# Patient Record
Sex: Female | Born: 1960 | Race: White | Hispanic: No | Smoking: Former smoker
Health system: Southern US, Community
[De-identification: ages and names within clinical notes are randomized; demographics above are authoritative.]

## PROBLEM LIST (undated history)

## (undated) DIAGNOSIS — IMO0002 Reserved for concepts with insufficient information to code with codable children: Secondary | ICD-10-CM

## (undated) DIAGNOSIS — F1020 Alcohol dependence, uncomplicated: Secondary | ICD-10-CM

## (undated) DIAGNOSIS — I10 Essential (primary) hypertension: Secondary | ICD-10-CM

## (undated) DIAGNOSIS — M329 Systemic lupus erythematosus, unspecified: Secondary | ICD-10-CM

## (undated) DIAGNOSIS — K766 Portal hypertension: Secondary | ICD-10-CM

## (undated) DIAGNOSIS — T1491XA Suicide attempt, initial encounter: Secondary | ICD-10-CM

## (undated) DIAGNOSIS — N200 Calculus of kidney: Secondary | ICD-10-CM

## (undated) DIAGNOSIS — E7800 Pure hypercholesterolemia, unspecified: Secondary | ICD-10-CM

## (undated) DIAGNOSIS — S0990XA Unspecified injury of head, initial encounter: Secondary | ICD-10-CM

## (undated) HISTORY — PX: CHOLECYSTECTOMY: SHX55

## (undated) HISTORY — PX: BACK SURGERY: SHX140

## (undated) HISTORY — PX: ABDOMINAL HYSTERECTOMY: SHX81

---

## 1996-07-25 HISTORY — PX: GASTRIC BYPASS: SHX52

## 1997-07-25 DIAGNOSIS — T1491XA Suicide attempt, initial encounter: Secondary | ICD-10-CM

## 1997-07-25 HISTORY — DX: Suicide attempt, initial encounter: T14.91XA

## 2011-05-13 ENCOUNTER — Emergency Department (HOSPITAL_COMMUNITY)
Admission: EM | Admit: 2011-05-13 | Discharge: 2011-05-13 | Discharge status: Against Medical Advice | Payer: Medicare Other | Attending: Emergency Medicine | Admitting: Emergency Medicine

## 2011-05-13 DIAGNOSIS — F1010 Alcohol abuse, uncomplicated: Secondary | ICD-10-CM | POA: Insufficient documentation

## 2011-05-13 DIAGNOSIS — I10 Essential (primary) hypertension: Secondary | ICD-10-CM | POA: Insufficient documentation

## 2011-05-13 LAB — DIFFERENTIAL
Basophils Absolute: 0 10*3/uL (ref 0.0–0.1)
Basophils Relative: 0 % (ref 0–1)
Eosinophils Absolute: 0.2 10*3/uL (ref 0.0–0.7)
Monocytes Relative: 6 % (ref 3–12)
Neutro Abs: 4.9 10*3/uL (ref 1.7–7.7)
Neutrophils Relative %: 60 % (ref 43–77)

## 2011-05-13 LAB — RAPID URINE DRUG SCREEN, HOSP PERFORMED
Barbiturates: POSITIVE — AB
Cocaine: POSITIVE — AB
Tetrahydrocannabinol: NOT DETECTED

## 2011-05-13 LAB — CBC
Hemoglobin: 10.6 g/dL — ABNORMAL LOW (ref 12.0–15.0)
Platelets: 323 10*3/uL (ref 150–400)
RBC: 4.79 MIL/uL (ref 3.87–5.11)
WBC: 8.1 10*3/uL (ref 4.0–10.5)

## 2011-05-13 LAB — COMPREHENSIVE METABOLIC PANEL
ALT: 16 U/L (ref 0–35)
AST: 22 U/L (ref 0–37)
Alkaline Phosphatase: 131 U/L — ABNORMAL HIGH (ref 39–117)
CO2: 26 mEq/L (ref 19–32)
Calcium: 9.3 mg/dL (ref 8.4–10.5)
Chloride: 102 mEq/L (ref 96–112)
GFR calc Af Amer: >90 mL/min (ref >90)
GFR calc non Af Amer: >90 mL/min (ref >90)
Glucose, Bld: 93 mg/dL (ref 70–99)
Potassium: 3.6 mEq/L (ref 3.5–5.1)
Sodium: 140 mEq/L (ref 135–145)

## 2011-05-15 ENCOUNTER — Emergency Department (HOSPITAL_COMMUNITY)
Admission: EM | Admit: 2011-05-15 | Discharge: 2011-05-15 | Disposition: A | Discharge status: Home / Self Care | Payer: Medicare Other | Attending: Emergency Medicine | Admitting: Emergency Medicine

## 2011-05-15 ENCOUNTER — Emergency Department (HOSPITAL_COMMUNITY): Payer: Medicare Other

## 2011-05-15 DIAGNOSIS — W108XXA Fall (on) (from) other stairs and steps, initial encounter: Secondary | ICD-10-CM | POA: Insufficient documentation

## 2011-05-15 DIAGNOSIS — Z981 Arthrodesis status: Secondary | ICD-10-CM | POA: Insufficient documentation

## 2011-05-15 DIAGNOSIS — IMO0002 Reserved for concepts with insufficient information to code with codable children: Secondary | ICD-10-CM | POA: Insufficient documentation

## 2011-05-15 DIAGNOSIS — I10 Essential (primary) hypertension: Secondary | ICD-10-CM | POA: Insufficient documentation

## 2011-05-17 ENCOUNTER — Encounter (HOSPITAL_COMMUNITY): Payer: Self-pay | Admitting: Emergency Medicine

## 2011-08-02 ENCOUNTER — Encounter: Payer: Medicare Other | Admitting: Physical Medicine & Rehabilitation

## 2011-08-12 ENCOUNTER — Emergency Department (HOSPITAL_COMMUNITY)
Admission: EM | Admit: 2011-08-12 | Discharge: 2011-08-13 | Disposition: A | Discharge status: Home / Self Care | Payer: Medicare Other | Attending: Emergency Medicine | Admitting: Emergency Medicine

## 2011-08-12 ENCOUNTER — Encounter (HOSPITAL_COMMUNITY): Payer: Self-pay | Admitting: Emergency Medicine

## 2011-08-12 DIAGNOSIS — R51 Headache: Secondary | ICD-10-CM | POA: Insufficient documentation

## 2011-08-12 DIAGNOSIS — H53459 Other localized visual field defect, unspecified eye: Secondary | ICD-10-CM | POA: Insufficient documentation

## 2011-08-12 DIAGNOSIS — I10 Essential (primary) hypertension: Secondary | ICD-10-CM | POA: Insufficient documentation

## 2011-08-12 DIAGNOSIS — R110 Nausea: Secondary | ICD-10-CM | POA: Insufficient documentation

## 2011-08-12 DIAGNOSIS — Z79899 Other long term (current) drug therapy: Secondary | ICD-10-CM | POA: Insufficient documentation

## 2011-08-12 HISTORY — DX: Essential (primary) hypertension: I10

## 2011-08-12 HISTORY — DX: Calculus of kidney: N20.0

## 2011-08-12 LAB — BASIC METABOLIC PANEL
BUN: 13 mg/dL (ref 6–23)
Calcium: 9.4 mg/dL (ref 8.4–10.5)
Creatinine, Ser: 0.62 mg/dL (ref 0.50–1.10)
GFR calc non Af Amer: >90 mL/min (ref >90)
Glucose, Bld: 104 mg/dL — ABNORMAL HIGH (ref 70–99)
Sodium: 136 mEq/L (ref 135–145)

## 2011-08-12 LAB — CBC
Hemoglobin: 12.4 g/dL (ref 12.0–15.0)
MCHC: 31.4 g/dL (ref 30.0–36.0)
RDW: 21 % — ABNORMAL HIGH (ref 11.5–15.5)
WBC: 9.4 10*3/uL (ref 4.0–10.5)

## 2011-08-12 LAB — DIFFERENTIAL
Eosinophils Absolute: 0.2 10*3/uL (ref 0.0–0.7)
Eosinophils Relative: 2 % (ref 0–5)
Lymphs Abs: 2.2 10*3/uL (ref 0.7–4.0)
Monocytes Absolute: 0.6 10*3/uL (ref 0.1–1.0)
Neutrophils Relative %: 68 % (ref 43–77)

## 2011-08-12 MED ORDER — OXYCODONE-ACETAMINOPHEN 5-325 MG PO TABS: 2.0000 | ORAL_TABLET | ORAL | Status: AC | PRN

## 2011-08-12 MED ORDER — MORPHINE SULFATE 4 MG/ML IJ SOLN
4.0000 mg | Freq: Once | INTRAMUSCULAR | Status: AC
  Administered 2011-08-12: 4 mg via INTRAVENOUS
  Filled 2011-08-12: qty 1

## 2011-08-12 MED ORDER — LABETALOL HCL 5 MG/ML IV SOLN
20.0000 mg | Freq: Once | INTRAVENOUS | Status: DC
  Filled 2011-08-12: qty 4

## 2011-08-12 MED ORDER — KETOROLAC TROMETHAMINE 30 MG/ML IJ SOLN
30.0000 mg | Freq: Once | INTRAMUSCULAR | Status: AC
  Administered 2011-08-12: 30 mg via INTRAVENOUS
  Filled 2011-08-12: qty 1

## 2011-08-12 MED ORDER — SODIUM CHLORIDE 0.9 % IV SOLN
INTRAVENOUS | Status: DC
  Administered 2011-08-12: 21:00:00 via INTRAVENOUS

## 2011-08-12 MED ORDER — PROMETHAZINE HCL 25 MG/ML IJ SOLN
25.0000 mg | INTRAMUSCULAR | Status: AC
  Administered 2011-08-12: 25 mg via INTRAVENOUS
  Filled 2011-08-12: qty 1

## 2011-08-12 NOTE — ED Notes (Signed)
Per ems report- Pt coming from home where her blood pressure began to get too high.  Pt states that bp normally runs around 138/65.  Pt bp at home was 178/98.  Pt also states she is having a headache and blurred vision.

## 2011-08-12 NOTE — ED Provider Notes (Signed)
History     CSN: [id number]  Arrival date & time 08/12/11  1900   First MD Initiated Contact with Patient 08/12/11 1914      Chief Complaint  Patient presents with  . Hypertension    (Consider location/radiation/quality/duration/timing/severity/associated sxs/prior treatment) Patient is a 51 y.o. female presenting with hypertension. The history is provided by the patient.  Hypertension Associated symptoms include headaches. Pertinent negatives include no chest pain and no shortness of breath.   the patient is a 51 year old, female, with a history of hypertension, who presents to emergency department complaining of a throbbing headache with seeing flashing lights, and nausea.  She states that her blood pressure has been elevated today, and she is very worried about how high.  It is.  She states that the systolic blood pressure was greater than 100.  She takes 20 mg of lisinopril daily for her hypertension.  She has not missed her medications.  She called her  Family doctor and was told to come to the emergency department for further evaluation.  She denies vomiting, cough, difficulty breathing, diarrhea, urinary tract symptoms.  She denies pain anywhere except in her head.  Past Medical History  Diagnosis Date  . Hypertension   . Kidney stones     Past Surgical History  Procedure Date  . Back surgery   . Cholecystectomy     No family history on file.  History  Substance Use Topics  . Smoking status: Not on file  . Smokeless tobacco: Not on file  . Alcohol Use:     OB History    Grav Para Term Preterm Abortions TAB SAB Ect Mult Living                  Review of Systems  Constitutional: Negative for fever.  Eyes:       [place]  Respiratory: Negative for shortness of breath.   Cardiovascular: Negative for chest pain.  Gastrointestinal: Positive for nausea. Negative for vomiting.  Genitourinary: Negative for dysuria.  Neurological: Positive for headaches. Negative  for dizziness.  Psychiatric/Behavioral: Negative for confusion.  All other systems reviewed and are negative.    Allergies  Kiwi extract and Morphine and related  Home Medications   Current Outpatient Rx  Name Route Sig Dispense Refill  . ALBUTEROL SULFATE HFA 108 (90 BASE) MCG/ACT IN AERS Inhalation Inhale 2 puffs into the lungs every 6 (six) hours as needed. For shortness of breath    . BUPROPION HCL ER (XL) 300 MG PO TB24 Oral Take 300 mg by mouth daily.    Marland Kitchen EPIPEN IJ Injection Inject 1 application as directed See admin instructions. For sever allergic reaction    . FERROUS FUMARATE 325 (106 FE) MG PO TABS Oral Take 1 tablet by mouth 3 (three) times daily.    Marland Kitchen LIDOCAINE 5 % EX PTCH Transdermal Place 1 patch onto the skin daily. Remove & Discard patch within 12 hours or as directed by MD    . LISINOPRIL 20 MG PO TABS Oral Take 20 mg by mouth daily.    Marland Kitchen LOPERAMIDE HCL 2 MG PO CAPS Oral Take 4 mg by mouth 4 (four) times daily.    Marland Kitchen OMEPRAZOLE 20 MG PO CPDR Oral Take 20 mg by mouth at bedtime.    . OMEPRAZOLE 40 MG PO CPDR Oral Take 40 mg by mouth daily.    Marland Kitchen ONDANSETRON 8 MG PO TBDP Oral Take 8 mg by mouth every 8 (eight) hours as needed. For  nausea    . OXYCODONE-ACETAMINOPHEN 5-325 MG PO TABS Oral Take 2 tablets by mouth every 6 (six) hours as needed. For pain    . ROPINIROLE HCL 1 MG PO TABS Oral Take 1 mg by mouth at bedtime.    Marland Kitchen TEMAZEPAM 30 MG PO CAPS Oral Take 30 mg by mouth at bedtime.    . VENLAFAXINE HCL ER 150 MG PO CP24 Oral Take 150 mg by mouth daily.      BP 162/100  Pulse 82  Temp(Src) 98.1 F (36.7 C) (Oral)  Resp 16  SpO2 98%  Physical Exam  Vitals reviewed. Constitutional: She is oriented to person, place, and time. She appears well-developed and well-nourished.  HENT:  Head: Normocephalic and atraumatic.  Eyes: EOM are normal. Pupils are equal, round, and reactive to light.  Neck: Normal range of motion. Neck supple.       No nuchal rigidity    Cardiovascular: Normal rate and regular rhythm.   No murmur heard. Pulmonary/Chest: Effort normal and breath sounds normal.  Abdominal: Soft. Bowel sounds are normal. There is no tenderness.  Musculoskeletal: Normal range of motion.  Neurological: She is alert and oriented to person, place, and time. No cranial nerve deficit. Coordination normal.  Skin: Skin is warm and dry. No rash noted.  Psychiatric: Her behavior is normal. Judgment and thought content normal.       Anxious    ED Course  Procedures (including critical care time) 51 year old, female, with a history of hypertension, presents with recorded, elevated blood pressure 278/114.  Presently, her blood pressure is 144/93.  She also has symptoms consistent with a migraine headache.  I will treat her migraine headache, and perform blood chemistry, to look at her renal function, but there is no indication for treating her blood pressure at this time.  She has no evidence of endorgan damage.  At this time.   Labs Reviewed  CBC  DIFFERENTIAL  BASIC METABOLIC PANEL   No results found.   No diagnosis found.  11:48 PM sxs resolved  MDM  Hypertension Migraine - no neurological deficit.  Alteration in mental status.  No signs of CNS or systemic infection.  There is no indication for a CAT scan at this time.        Nicholes Stairs, MD 08/12/11 2348

## 2011-08-17 ENCOUNTER — Emergency Department (HOSPITAL_COMMUNITY)
Admission: EM | Admit: 2011-08-17 | Discharge: 2011-08-18 | Disposition: A | Discharge status: Other Acute Inpatient Hospital | Payer: Medicare Other | Source: Home / Self Care | Attending: Emergency Medicine | Admitting: Emergency Medicine

## 2011-08-17 ENCOUNTER — Encounter (HOSPITAL_COMMUNITY): Payer: Self-pay | Admitting: *Deleted

## 2011-08-17 ENCOUNTER — Other Ambulatory Visit: Payer: Self-pay

## 2011-08-17 DIAGNOSIS — M25569 Pain in unspecified knee: Secondary | ICD-10-CM | POA: Insufficient documentation

## 2011-08-17 DIAGNOSIS — F3289 Other specified depressive episodes: Secondary | ICD-10-CM | POA: Insufficient documentation

## 2011-08-17 DIAGNOSIS — R45851 Suicidal ideations: Secondary | ICD-10-CM | POA: Insufficient documentation

## 2011-08-17 DIAGNOSIS — R404 Transient alteration of awareness: Secondary | ICD-10-CM | POA: Insufficient documentation

## 2011-08-17 DIAGNOSIS — K766 Portal hypertension: Secondary | ICD-10-CM | POA: Insufficient documentation

## 2011-08-17 DIAGNOSIS — Y92009 Unspecified place in unspecified non-institutional (private) residence as the place of occurrence of the external cause: Secondary | ICD-10-CM | POA: Insufficient documentation

## 2011-08-17 DIAGNOSIS — T07XXXA Unspecified multiple injuries, initial encounter: Secondary | ICD-10-CM | POA: Insufficient documentation

## 2011-08-17 DIAGNOSIS — M949 Disorder of cartilage, unspecified: Secondary | ICD-10-CM | POA: Insufficient documentation

## 2011-08-17 DIAGNOSIS — F329 Major depressive disorder, single episode, unspecified: Secondary | ICD-10-CM | POA: Insufficient documentation

## 2011-08-17 DIAGNOSIS — Z79899 Other long term (current) drug therapy: Secondary | ICD-10-CM | POA: Insufficient documentation

## 2011-08-17 DIAGNOSIS — M329 Systemic lupus erythematosus, unspecified: Secondary | ICD-10-CM | POA: Insufficient documentation

## 2011-08-17 DIAGNOSIS — M899 Disorder of bone, unspecified: Secondary | ICD-10-CM | POA: Insufficient documentation

## 2011-08-17 HISTORY — DX: Portal hypertension: K76.6

## 2011-08-17 HISTORY — DX: Alcohol dependence, uncomplicated: F10.20

## 2011-08-17 HISTORY — DX: Systemic lupus erythematosus, unspecified: M32.9

## 2011-08-17 HISTORY — DX: Reserved for concepts with insufficient information to code with codable children: IMO0002

## 2011-08-17 HISTORY — DX: Suicide attempt, initial encounter: T14.91XA

## 2011-08-17 HISTORY — DX: Unspecified injury of head, initial encounter: S09.90XA

## 2011-08-17 LAB — CBC
HCT: 36.8 % (ref 36.0–46.0)
MCV: 72.9 fL — ABNORMAL LOW (ref 78.0–100.0)
RBC: 5.05 MIL/uL (ref 3.87–5.11)
WBC: 7.6 10*3/uL (ref 4.0–10.5)

## 2011-08-17 LAB — RAPID URINE DRUG SCREEN, HOSP PERFORMED
Amphetamines: NOT DETECTED
Benzodiazepines: POSITIVE — AB
Opiates: NOT DETECTED

## 2011-08-17 LAB — DIFFERENTIAL
Eosinophils Relative: 2 % (ref 0–5)
Lymphocytes Relative: 29 % (ref 12–46)
Lymphs Abs: 2.2 10*3/uL (ref 0.7–4.0)
Monocytes Absolute: 0.7 10*3/uL (ref 0.1–1.0)
Monocytes Relative: 9 % (ref 3–12)

## 2011-08-17 LAB — COMPREHENSIVE METABOLIC PANEL
AST: 40 U/L — ABNORMAL HIGH (ref 0–37)
Albumin: 3.3 g/dL — ABNORMAL LOW (ref 3.5–5.2)
BUN: 20 mg/dL (ref 6–23)
Creatinine, Ser: 0.65 mg/dL (ref 0.50–1.10)
Potassium: 4.2 mEq/L (ref 3.5–5.1)
Total Protein: 6.4 g/dL (ref 6.0–8.3)

## 2011-08-17 LAB — URINALYSIS, ROUTINE W REFLEX MICROSCOPIC
Bilirubin Urine: NEGATIVE
Glucose, UA: NEGATIVE mg/dL
Hgb urine dipstick: NEGATIVE
Ketones, ur: NEGATIVE mg/dL
Leukocytes, UA: NEGATIVE
pH: 6.5 (ref 5.0–8.0)

## 2011-08-17 LAB — ACETAMINOPHEN LEVEL: Acetaminophen (Tylenol), Serum: 34.4 ug/mL — ABNORMAL HIGH (ref 10–30)

## 2011-08-17 MED ORDER — IBUPROFEN 600 MG PO TABS: 600.0000 mg | ORAL_TABLET | Freq: Three times a day (TID) | ORAL | Status: DC | PRN

## 2011-08-17 MED ORDER — SODIUM CHLORIDE 0.9 % IV BOLUS (SEPSIS)
1000.0000 mL | Freq: Once | INTRAVENOUS | Status: AC
  Administered 2011-08-17: 1000 mL via INTRAVENOUS

## 2011-08-17 MED ORDER — TRAMADOL HCL 50 MG PO TABS
50.0000 mg | ORAL_TABLET | Freq: Four times a day (QID) | ORAL | Status: DC | PRN
  Administered 2011-08-18 (×2): 50 mg via ORAL
  Filled 2011-08-17 (×2): qty 1

## 2011-08-17 MED ORDER — ZIPRASIDONE MESYLATE 20 MG IM SOLR: 10.0000 mg | Freq: Once | INTRAMUSCULAR | Status: DC

## 2011-08-17 MED ORDER — ACETAMINOPHEN 325 MG PO TABS
650.0000 mg | ORAL_TABLET | ORAL | Status: DC | PRN
  Administered 2011-08-18: 650 mg via ORAL
  Filled 2011-08-17: qty 2

## 2011-08-17 MED ORDER — LORAZEPAM 2 MG/ML IJ SOLN
0.5000 mg | Freq: Once | INTRAMUSCULAR | Status: AC
  Administered 2011-08-17: 0.5 mg via INTRAVENOUS

## 2011-08-17 MED ORDER — LORAZEPAM 2 MG/ML IJ SOLN
INTRAMUSCULAR | Status: AC
  Administered 2011-08-17: 0.5 mg via INTRAVENOUS
  Filled 2011-08-17: qty 1

## 2011-08-17 MED ORDER — LORAZEPAM 2 MG/ML IJ SOLN
0.5000 mg | Freq: Once | INTRAMUSCULAR | Status: AC
  Administered 2011-08-17: 0.5 mg via INTRAVENOUS
  Filled 2011-08-17: qty 1

## 2011-08-17 MED ORDER — LORAZEPAM 1 MG PO TABS
1.0000 mg | ORAL_TABLET | ORAL | Status: DC | PRN
  Administered 2011-08-18 (×3): 1 mg via ORAL
  Filled 2011-08-17 (×3): qty 1

## 2011-08-17 MED ORDER — HYDROMORPHONE HCL PF 1 MG/ML IJ SOLN
1.0000 mg | Freq: Once | INTRAMUSCULAR | Status: AC
  Administered 2011-08-17: 1 mg via INTRAVENOUS
  Filled 2011-08-17: qty 1

## 2011-08-17 MED ORDER — ONDANSETRON HCL 4 MG PO TABS: 4.0000 mg | ORAL_TABLET | Freq: Three times a day (TID) | ORAL | Status: DC | PRN
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
## 2011-08-17 NOTE — ED Notes (Signed)
FAO:[id number]<MV> Expected date:08/17/11<BR> Expected time: 4:58 PM<BR> Means of arrival:Ambulance<BR> Comments:<BR> EMS 11 GC, 50 yof took 5 to 15 Fioricet lethargic
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
## 2011-08-17 NOTE — ED Notes (Signed)
Report received from Tracy RN

## 2011-08-17 NOTE — ED Notes (Signed)
Pt yelled for help. Upon entering her room, pt was sitting up trying to open her [place] sandwich. Pt stated she needed the bedside table. Pt then began to cry and stated "I don't want to die". Pt denied any SI/HI. Telepsych at bedside and in progress. Will continue to monitor

## 2011-08-18 ENCOUNTER — Encounter (HOSPITAL_COMMUNITY): Payer: Self-pay

## 2011-08-18 ENCOUNTER — Inpatient Hospital Stay (HOSPITAL_COMMUNITY)
Admission: EM | Admit: 2011-08-18 | Discharge: 2011-08-24 | DRG: 885 | Disposition: A | Discharge status: Home / Self Care | Payer: Medicare Other | Source: Ambulatory Visit | Attending: Psychiatry | Admitting: Psychiatry

## 2011-08-18 ENCOUNTER — Encounter (HOSPITAL_COMMUNITY): Payer: Self-pay | Admitting: *Deleted

## 2011-08-18 ENCOUNTER — Emergency Department (HOSPITAL_COMMUNITY): Payer: Medicare Other

## 2011-08-18 DIAGNOSIS — F1021 Alcohol dependence, in remission: Secondary | ICD-10-CM

## 2011-08-18 DIAGNOSIS — M129 Arthropathy, unspecified: Secondary | ICD-10-CM

## 2011-08-18 DIAGNOSIS — T07XXXA Unspecified multiple injuries, initial encounter: Secondary | ICD-10-CM

## 2011-08-18 DIAGNOSIS — G8929 Other chronic pain: Secondary | ICD-10-CM

## 2011-08-18 DIAGNOSIS — F339 Major depressive disorder, recurrent, unspecified: Secondary | ICD-10-CM | POA: Diagnosis present

## 2011-08-18 DIAGNOSIS — F332 Major depressive disorder, recurrent severe without psychotic features: Principal | ICD-10-CM

## 2011-08-18 DIAGNOSIS — Z79899 Other long term (current) drug therapy: Secondary | ICD-10-CM

## 2011-08-18 DIAGNOSIS — F4310 Post-traumatic stress disorder, unspecified: Secondary | ICD-10-CM

## 2011-08-18 DIAGNOSIS — M549 Dorsalgia, unspecified: Secondary | ICD-10-CM

## 2011-08-18 DIAGNOSIS — I10 Essential (primary) hypertension: Secondary | ICD-10-CM

## 2011-08-18 DIAGNOSIS — K766 Portal hypertension: Secondary | ICD-10-CM

## 2011-08-18 DIAGNOSIS — M329 Systemic lupus erythematosus, unspecified: Secondary | ICD-10-CM

## 2011-08-18 DIAGNOSIS — F411 Generalized anxiety disorder: Secondary | ICD-10-CM

## 2011-08-18 DIAGNOSIS — T391X1A Poisoning by 4-Aminophenol derivatives, accidental (unintentional), initial encounter: Secondary | ICD-10-CM

## 2011-08-18 DIAGNOSIS — T394X2A Poisoning by antirheumatics, not elsewhere classified, intentional self-harm, initial encounter: Secondary | ICD-10-CM

## 2011-08-18 DIAGNOSIS — Z87442 Personal history of urinary calculi: Secondary | ICD-10-CM

## 2011-08-18 DIAGNOSIS — IMO0002 Reserved for concepts with insufficient information to code with codable children: Secondary | ICD-10-CM

## 2011-08-18 MED ORDER — LIDOCAINE 5 % EX PTCH
1.0000 | MEDICATED_PATCH | CUTANEOUS | Status: DC
  Administered 2011-08-19 – 2011-08-22 (×5): 1 via TRANSDERMAL
  Filled 2011-08-18 (×5): qty 1

## 2011-08-18 MED ORDER — MAGNESIUM HYDROXIDE 400 MG/5ML PO SUSP: 30.0000 mL | Freq: Every day | ORAL | Status: DC | PRN

## 2011-08-18 MED ORDER — ALBUTEROL SULFATE HFA 108 (90 BASE) MCG/ACT IN AERS: 2.0000 | INHALATION_SPRAY | Freq: Four times a day (QID) | RESPIRATORY_TRACT | Status: DC | PRN

## 2011-08-18 MED ORDER — ALUM & MAG HYDROXIDE-SIMETH 200-200-20 MG/5ML PO SUSP: 30.0000 mL | ORAL | Status: DC | PRN

## 2011-08-18 MED ORDER — FERROUS FUMARATE 325 (106 FE) MG PO TABS
1.0000 | ORAL_TABLET | Freq: Three times a day (TID) | ORAL | Status: DC
  Administered 2011-08-18 (×2): 106 mg via ORAL
  Filled 2011-08-18 (×6): qty 1

## 2011-08-18 MED ORDER — LISINOPRIL 20 MG PO TABS
20.0000 mg | ORAL_TABLET | Freq: Every day | ORAL | Status: DC
  Administered 2011-08-18: 20 mg via ORAL
  Filled 2011-08-18 (×2): qty 1

## 2011-08-18 MED ORDER — BACITRACIN-NEOMYCIN-POLYMYXIN 400-5-5000 EX OINT
TOPICAL_OINTMENT | CUTANEOUS | Status: AC
  Administered 2011-08-18: 1
  Filled 2011-08-18: qty 1

## 2011-08-18 MED ORDER — LOPERAMIDE HCL 2 MG PO CAPS
4.0000 mg | ORAL_CAPSULE | Freq: Four times a day (QID) | ORAL | Status: DC
  Administered 2011-08-18: 4 mg via ORAL
  Filled 2011-08-18 (×2): qty 1

## 2011-08-18 MED ORDER — ACETAMINOPHEN 325 MG PO TABS: 650.0000 mg | ORAL_TABLET | Freq: Four times a day (QID) | ORAL | Status: DC | PRN

## 2011-08-18 MED ORDER — MAGNESIUM HYDROXIDE 400 MG/5ML PO SUSP
30.0000 mL | Freq: Every day | ORAL | Status: DC | PRN
  Filled 2011-08-18: qty 30

## 2011-08-18 MED ORDER — VENLAFAXINE HCL ER 150 MG PO CP24
150.0000 mg | ORAL_CAPSULE | Freq: Every day | ORAL | Status: DC
  Administered 2011-08-18: 150 mg via ORAL
  Filled 2011-08-18 (×2): qty 1

## 2011-08-18 MED ORDER — ROPINIROLE HCL 1 MG PO TABS
1.0000 mg | ORAL_TABLET | Freq: Every day | ORAL | Status: DC
  Filled 2011-08-18 (×2): qty 1

## 2011-08-18 MED ORDER — IBUPROFEN 800 MG PO TABS
800.0000 mg | ORAL_TABLET | Freq: Three times a day (TID) | ORAL | Status: DC | PRN
  Administered 2011-08-18 – 2011-08-24 (×10): 800 mg via ORAL
  Filled 2011-08-18 (×10): qty 1

## 2011-08-18 MED ORDER — PANTOPRAZOLE SODIUM 40 MG PO TBEC
40.0000 mg | DELAYED_RELEASE_TABLET | Freq: Every day | ORAL | Status: DC
  Administered 2011-08-18: 40 mg via ORAL
  Filled 2011-08-18: qty 1

## 2011-08-18 MED ORDER — BUPROPION HCL ER (XL) 300 MG PO TB24
300.0000 mg | ORAL_TABLET | Freq: Every day | ORAL | Status: DC
  Administered 2011-08-18: 300 mg via ORAL
  Filled 2011-08-18 (×2): qty 1

## 2011-08-18 MED ORDER — ONDANSETRON 4 MG PO TBDP: 8.0000 mg | ORAL_TABLET | Freq: Three times a day (TID) | ORAL | Status: DC | PRN

## 2011-08-18 MED ORDER — LIDOCAINE 5 % EX PTCH
1.0000 | MEDICATED_PATCH | CUTANEOUS | Status: DC
  Filled 2011-08-18 (×2): qty 1

## 2011-08-18 MED ORDER — TEMAZEPAM 30 MG PO CAPS: 30.0000 mg | ORAL_CAPSULE | Freq: Every day | ORAL | Status: DC

## 2011-08-18 MED ORDER — AMITRIPTYLINE HCL 25 MG PO TABS
25.0000 mg | ORAL_TABLET | Freq: Every day | ORAL | Status: DC
  Administered 2011-08-18: 25 mg via ORAL
  Filled 2011-08-18 (×2): qty 1

## 2011-08-18 MED ORDER — HYDROXYZINE HCL 50 MG PO TABS
50.0000 mg | ORAL_TABLET | Freq: Three times a day (TID) | ORAL | Status: DC | PRN
  Administered 2011-08-18 – 2011-08-19 (×2): 50 mg via ORAL
  Filled 2011-08-18: qty 1

## 2011-08-18 NOTE — ED Notes (Signed)
Patient calls out for something to eat.  This nurse gives her a [place] sandwich, fruit and drink. While in the room, patient becomes tearful and verbally beligerent with staff regarding her stay here in the hospital.

## 2011-08-18 NOTE — ED Notes (Addendum)
CSW contacted [employer] Adult [employer] Services, to provide updated information on the report made by this writer on 08/17/11, regarding the pt's pending transfer to another facility. CSW is currently awaiting return phone call from [facility]. DSS.  18:14 CSW spoke with Hennie Duos. SW at DSS and provided updated information that the pt would be transferring to [facility]. Per Leonette Most, the information would be passed onto his supervisor for follow up.

## 2011-08-18 NOTE — ED Notes (Signed)
Report received from Night RN. Pt was sleeping at first contact. Pt now wake up, asking for [place] to help her. Reports she is hurting everywhere. Pt is dramatic in her pain, sts why can't we fix everything. Pt medicated for pain with ultram and ativan was also given to calm the patient. Pt now back to bed, attempting to rest. nad noted. Will continue to monitor.

## 2011-08-18 NOTE — Tx Team (Signed)
Initial Interdisciplinary Treatment Plan  PATIENT STRENGTHS: (choose at least two) Ability for insight Average or above average intelligence Capable of independent living Communication skills General fund of knowledge Motivation for treatment/growth Physical Health  PATIENT STRESSORS: Financial difficulties Health problems Legal issue Marital or family conflict   PROBLEM LIST: Problem List/Patient Goals Date to be addressed Date deferred Reason deferred Estimated date of resolution  depression      Anxiety                                                 DISCHARGE CRITERIA:  Ability to meet basic life and health needs Adequate post-discharge living arrangements Improved stabilization in mood, thinking, and/or behavior Reduction of life-threatening or endangering symptoms to within safe limits Verbal commitment to aftercare and medication compliance  PRELIMINARY DISCHARGE PLAN: Participate in family therapy Return to previous living arrangement  PATIENT/FAMIILY INVOLVEMENT: This treatment plan has been presented to and reviewed with the patient, Sharon Nicholson, and/or family member,   The patient and family have been given the opportunity to ask questions and make suggestions.  Marchia Bond 08/18/2011, 8:36 PM

## 2011-08-18 NOTE — ED Notes (Signed)
Pt ambulated to BR by self & to nurse's desk to use the phone.
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
## 2011-08-18 NOTE — ED Notes (Signed)
Per RN, pt is currently unsteady while standing and walking. [facility] RN was notified and suggests pt stay in ED until she can safely be transported.

## 2011-08-18 NOTE — ED Notes (Signed)
MD in room reassessed the patient. Pt said "I can't walk because my knee is dislocated"

## 2011-08-18 NOTE — BH Assessment (Signed)
Informed in shift report that patient was accepted this am, however; due to medical issues (unsteady gait and inability to completed ADL's) admission to [facility] was placed on hold. Received call from staff in the assessment office that patients bed was ready. Writer informed both nurse and EDP. Pt placed up for discharge by EDP. Pt's nurse made this writer aware that patient continues not to be able to complete ADL's appropriately. Writer called the assessment office and informed staff of patients status. The current PA that is on call at this time Verne Spurr) sts that patient is not able to come if she is not able to complete ADL's.   The EDP Agustin Cree, MD) was made aware that patient will not be accepted if she is unable to complete her ADL's appropriately . He sts that he will come and re-evaluate patient to determine if she is medically cleared and appropriate to be transferred to another facility for treatment.  Disposition is pending.

## 2011-08-18 NOTE — ED Notes (Signed)
Moved one patient belonging bag to locker #42

## 2011-08-18 NOTE — ED Notes (Signed)
MD at bedside to evaluate for IVC.

## 2011-08-18 NOTE — BHH Counselor (Signed)
Informed by AC-Eric K. That patient has been accepted to [facility]. The EDP-Jon Lynelle Doctor is aware of patients disposition and will discharge patient from the ED. Patient's room assignment is 401-1. Patient will be transferred via [employer] b/c she is under IVC. Patient's nurse-Elaine will call report and make the transport arrangements accordingly.
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
## 2011-08-18 NOTE — ED Notes (Signed)
Hospital security at bedside d/t patient's escalating behavior.  ACT team is consulted to see patient again.  Dr. Ranae Palms is informed of patient's escalation and need for evaluation.  Charge nurse is also notified.

## 2011-08-18 NOTE — ED Notes (Signed)
Pt to xray

## 2011-08-18 NOTE — ED Notes (Signed)
Pt is hitting the call bell against the bed rails. Upon entering the room, pt said she dropped her glasses and would like [place] to pick it up for her. Glasses picked up and given to patient. Pt started crying (no tears), said "I want to go home, I want my Mommy". A moment later, she said "I miss my son". Pt re-oriented. Meal given. Now eating her breakfast

## 2011-08-18 NOTE — ED Notes (Signed)
Awaiting [employer] for transport to [facility].

## 2011-08-18 NOTE — ED Notes (Addendum)
Pt got up and walked to the restroom. ACT team notified. EDP ordered radiology studies because pt c/o left knee dislocation
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
## 2011-08-18 NOTE — ED Notes (Signed)
Pt. Up out of bed pulling linen from the cabinet. Pt.was told that she could not be Getting out of bed because she had pain meds and the meds will cause her to fall  On the floor. Pt. Unsteady on her feet when getting out of bed. Pt. Spilled water on the floor and bed. Pt. Cleaned up and new Linen changed.

## 2011-08-18 NOTE — ED Notes (Signed)
Patient screaming loudly for pain medications, but when she is offered her ordered pain meds, she refuses and states, "that don't work!!"  Patient is offered ativan as prescribed for nerves and she states she will take that, so patient is medicated w/ ativan.
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
## 2011-08-18 NOTE — ED Notes (Signed)
Patient has now been served her IVC papers by [facility] officer T. Lorretta Harp.

## 2011-08-19 DIAGNOSIS — F339 Major depressive disorder, recurrent, unspecified: Secondary | ICD-10-CM

## 2011-08-19 DIAGNOSIS — F4310 Post-traumatic stress disorder, unspecified: Secondary | ICD-10-CM

## 2011-08-19 DIAGNOSIS — F1021 Alcohol dependence, in remission: Secondary | ICD-10-CM | POA: Diagnosis present

## 2011-08-19 LAB — URINALYSIS, ROUTINE W REFLEX MICROSCOPIC
Bilirubin Urine: NEGATIVE
Glucose, UA: NEGATIVE mg/dL
Ketones, ur: NEGATIVE mg/dL
Nitrite: NEGATIVE
Specific Gravity, Urine: 1.014 (ref 1.005–1.030)
pH: 6.5 (ref 5.0–8.0)

## 2011-08-19 MED ORDER — ONDANSETRON 4 MG PO TBDP: 8.0000 mg | ORAL_TABLET | Freq: Three times a day (TID) | ORAL | Status: DC | PRN

## 2011-08-19 MED ORDER — ARIPIPRAZOLE 2 MG PO TABS
2.0000 mg | ORAL_TABLET | ORAL | Status: DC
  Administered 2011-08-19 – 2011-08-22 (×7): 2 mg via ORAL
  Filled 2011-08-19 (×6): qty 1

## 2011-08-19 MED ORDER — LOPERAMIDE HCL 2 MG PO CAPS
4.0000 mg | ORAL_CAPSULE | Freq: Four times a day (QID) | ORAL | Status: DC
  Administered 2011-08-19 – 2011-08-22 (×8): 4 mg via ORAL
  Administered 2011-08-22: 2 mg via ORAL
  Administered 2011-08-22 – 2011-08-24 (×7): 4 mg via ORAL
  Filled 2011-08-19 (×20): qty 2

## 2011-08-19 MED ORDER — GABAPENTIN 300 MG PO CAPS
300.0000 mg | ORAL_CAPSULE | ORAL | Status: DC
  Administered 2011-08-19 – 2011-08-24 (×16): 300 mg via ORAL
  Filled 2011-08-19 (×16): qty 1

## 2011-08-19 MED ORDER — LISINOPRIL 20 MG PO TABS
20.0000 mg | ORAL_TABLET | Freq: Every day | ORAL | Status: DC
  Administered 2011-08-19 – 2011-08-24 (×6): 20 mg via ORAL
  Filled 2011-08-19 (×6): qty 1

## 2011-08-19 MED ORDER — DULOXETINE HCL 60 MG PO CPEP
60.0000 mg | ORAL_CAPSULE | Freq: Every day | ORAL | Status: DC
  Administered 2011-08-19 – 2011-08-24 (×6): 60 mg via ORAL
  Filled 2011-08-19 (×6): qty 1

## 2011-08-19 MED ORDER — LORAZEPAM 1 MG PO TABS
1.0000 mg | ORAL_TABLET | Freq: Four times a day (QID) | ORAL | Status: AC | PRN
  Administered 2011-08-19 – 2011-08-21 (×6): 1 mg via ORAL
  Filled 2011-08-19 (×7): qty 1

## 2011-08-19 MED ORDER — FERROUS SULFATE 325 (65 FE) MG PO TABS
325.0000 mg | ORAL_TABLET | Freq: Three times a day (TID) | ORAL | Status: DC
  Administered 2011-08-19 – 2011-08-24 (×15): 325 mg via ORAL
  Filled 2011-08-19 (×18): qty 1

## 2011-08-19 MED ORDER — LORAZEPAM 0.5 MG PO TABS
0.5000 mg | ORAL_TABLET | Freq: Three times a day (TID) | ORAL | Status: AC | PRN
  Administered 2011-08-22 (×2): 0.5 mg via ORAL
  Filled 2011-08-19 (×2): qty 1

## 2011-08-19 MED ORDER — FERROUS FUMARATE 325 (106 FE) MG PO TABS: 1.0000 | ORAL_TABLET | Freq: Three times a day (TID) | ORAL | Status: DC

## 2011-08-19 MED ORDER — PANTOPRAZOLE SODIUM 40 MG PO TBEC
40.0000 mg | DELAYED_RELEASE_TABLET | Freq: Every day | ORAL | Status: DC
  Administered 2011-08-19 – 2011-08-24 (×6): 40 mg via ORAL
  Filled 2011-08-19 (×6): qty 1

## 2011-08-19 MED ORDER — MIRTAZAPINE 15 MG PO TABS
15.0000 mg | ORAL_TABLET | Freq: Every day | ORAL | Status: DC
  Administered 2011-08-19 – 2011-08-21 (×3): 15 mg via ORAL
  Filled 2011-08-19 (×4): qty 1

## 2011-08-19 MED ORDER — DULOXETINE HCL 30 MG PO CPEP: 30.0000 mg | ORAL_CAPSULE | Freq: Every day | ORAL | Status: DC

## 2011-08-19 MED ORDER — ALBUTEROL SULFATE HFA 108 (90 BASE) MCG/ACT IN AERS: 2.0000 | INHALATION_SPRAY | Freq: Four times a day (QID) | RESPIRATORY_TRACT | Status: DC | PRN
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
## 2011-08-19 NOTE — Discharge Planning (Signed)
Saw Mickala in treatment team mtg with rest of team.  Disheveled, depressed appearing.  Moved her from [place] earlier this year and is hoping to get back there as this is a bad situation for her.  Admits to substance abuse issues, recent 1 day relapse.  Wants help with depression, anxiety.  Will link with services. Per State Regulation 482.30 This chart was reviewed for medical necessity with respect to the patient's Admission/Duration of stay. Daryel Gerald, [place]  08/19/2011  Next Review Date:  08/22/11
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
## 2011-08-20 LAB — T3, FREE: T3, Free: 2.3 pg/mL (ref 2.3–4.2)

## 2011-08-20 LAB — VITAMIN B12: Vitamin B-12: 463 pg/mL (ref 211–911)

## 2011-08-20 LAB — TSH: TSH: 0.196 u[IU]/mL — ABNORMAL LOW (ref 0.350–4.500)

## 2011-08-20 MED ORDER — OXYCODONE-ACETAMINOPHEN 5-325 MG PO TABS
1.0000 | ORAL_TABLET | Freq: Three times a day (TID) | ORAL | Status: DC | PRN
  Administered 2011-08-20 – 2011-08-23 (×8): 1 via ORAL
  Filled 2011-08-20 (×8): qty 1

## 2011-08-20 NOTE — Progress Notes (Signed)
Patient ID: Sharon Nicholson, female   DOB: [birth year], 51 y.o.   MRN: [id number]  Pt has been very anxious on the unit today, pt has requested Ativan every 6 hours. Pt reported to this nurse that she did not understand why she was on the 400 hall, and felt that she should be moved. Pt has complained of being sore from the fall, a heat pack was given. Pt took all medication as prescribed by the doctor, no other issues or concerns noted. Pt reported being negative SI/HI, no AH/VH noted. Pt did attend all groups however reported that she did not benefit because of the people in group.

## 2011-08-20 NOTE — Progress Notes (Signed)
No physical complaints or behavioral problems.  Sleeping at long intervals.  Q 15 min safety checks in progress.

## 2011-08-20 NOTE — Progress Notes (Signed)
[facility] Group Notes:  (Counselor/Nursing/MHT/Case Management/Adjunct)  08/20/2011 11 AM  Type of Therapy:  Group Therapy, Dance/Movement Therapy   Participation Level:  Active  Participation Quality:  Attentive and Sharing  Affect:  Appropriate  Cognitive:  Appropriate  Insight:  Limited  Engagement in Group:  Limited  Engagement in Therapy:  Limited  Modes of Intervention:  Clarification, Problem-solving, Role-play, Socialization and Support  Summary of Progress/Problems: Pt participated in a group discussion on self sabotage. Pt spoke about how anger often lead to doing things that are unhealthy that sabotage their recovery and treatment. Pt spoke about "drinking" when angry and knowing it is unhealthy but not knowing how to stop.    Gevena Mart

## 2011-08-20 NOTE — Progress Notes (Signed)
  Sharon Nicholson is a 51 y.o. female [id number] June 20, 1961  08/18/2011 Principal Problem:  *Major depressive disorder, recurrent episode Active Problems:  Alcohol dependence in remission  PTSD (post-traumatic stress disorder)   Mental Status: alert & oriented mood and affect depressed denies SI/HI/AVH  Subjective/Objective: Asking for her Percocet says she never abuses her sleep or pain meds. Does have bruises and lacerations. Has been taking all other pain meds with reported benefit.      Filed Vitals:   08/20/11 0812  BP: 133/90  Pulse: 81  Temp:   Resp:     Lab Results:   BMET    Component Value Date/Time   NA 135 08/17/2011 1750   K 4.2 08/17/2011 1750   CL 101 08/17/2011 1750   CO2 24 08/17/2011 1750   GLUCOSE 97 08/17/2011 1750   BUN 20 08/17/2011 1750   CREATININE 0.65 08/17/2011 1750   CALCIUM 9.4 08/17/2011 1750   GFRNONAA >90 08/17/2011 1750   GFRAA >90 08/17/2011 1750    Medications:  Scheduled:     . ARIPiprazole  2 mg Oral BH-qamhs  . DULoxetine  60 mg Oral Daily  . ferrous sulfate  325 mg Oral TID WC  . gabapentin  300 mg Oral [id number]  . lidocaine  1 patch Transdermal Q24H  . lisinopril  20 mg Oral Daily  . loperamide  4 mg Oral QID  . mirtazapine  15 mg Oral QHS  . pantoprazole  40 mg Oral Q1200     PRN Meds acetaminophen, albuterol, alum & mag hydroxide-simeth, ibuprofen, LORazepam, LORazepam, magnesium hydroxide, ondansetron Will reorder her Percocet.  [name],MICKIE D. 08/20/2011

## 2011-08-20 NOTE — Progress Notes (Signed)
Pt. attended and participated in aftercare planning group. Pt. accepted information on suicide prevention, warning signs to look for with suicide and crisis line numbers to use. The pt. agreed to call crisis line numbers if having warning signs or having thoughts of suicide. Pt. Stated that they did not sleep well for she "tossed and turned" due to "lots of pain.

## 2011-08-21 DIAGNOSIS — F1021 Alcohol dependence, in remission: Secondary | ICD-10-CM

## 2011-08-21 NOTE — Progress Notes (Signed)
[facility] Group Notes:  (Counselor/Nursing/MHT/Case Management/Adjunct)  08/21/2011 1315  Type of Therapy:  Group Therapy  Participation Level:  Active  Affect:  Appropriate  Cognitive:  Appropriate  Engagement in Therapy:  Good  Modes of Intervention:  Clarification, Limit-setting, Problem-solving, Socialization and Support  Summary of Progress/Problems: Pt. participated in group session on supports. Pt. was asked what support means to them, who are their supports, what is the difference between unhealthy and healthy supports and what they can do when their support is not there.  Pt arrived in group late but was able to process well on how she can support herself. Pt shared that she felt self support when she spent time with her son.   Crosswell, Desiree 08/21/2011, 2:38 PM

## 2011-08-21 NOTE — Progress Notes (Signed)
[facility] Adult Inpatient Family/Significant Other Suicide Prevention Education  Suicide Prevention Education:  Contact Attempts: Clara  Straw-[phone](pt.'s mother) has been identified by the patient as the family member/significant other with whom the patient will be residing, and identified as the person(s) who will aid the patient in the event of a mental health crisis.  With written consent from the patient, two attempts were made to provide suicide prevention education, prior to and/or following the patient's discharge.  We were unsuccessful in providing suicide prevention education.  A suicide education pamphlet was given to the patient to share with family/significant other.  Date and time of first attempt: by Lamar Blinks on 08-21-11  At 2:42 p.m. Date and time of second attempt:  Neila Gear 08/21/2011, 2:39 PM

## 2011-08-21 NOTE — Progress Notes (Signed)
Pt has been up and around milieu tonight. On 1:1 she reports anxiety but reports it has lessened since this morning. Affect congruent with pt's report. Medicated for pain, scheduled meds given without difficulty. She is pleasant and cooperative. On reassessment of pain, pt is asleep. Denies SI/HI/AVH and remains safe. Lawrence Marseilles

## 2011-08-21 NOTE — Progress Notes (Signed)
  Smt. Loder is a 51 y.o. female [id number] Jun 23, 1961  08/18/2011 Principal Problem:  *Major depressive disorder, recurrent episode Active Problems:  Alcohol dependence in remission  PTSD (post-traumatic stress disorder)   Mental Status: Alert and oriented denies SI/HI/AVH. Mood and affect congruent with reports of muscle pain etc.    Subjective/Objective: Remains focused on her pain and Ativan. Reviewed with her that the Ativan had been ordered for 48 hours and she is not prescribed outside. She of course wants the  Percocet increased but again her reports of discomfort are not congruent with her presentatio     Filed Vitals:   08/21/11 0559  BP: 122/81  Pulse: 76  Temp:   Resp: 16    Lab Results:   BMET    Component Value Date/Time   NA 135 08/17/2011 1750   K 4.2 08/17/2011 1750   CL 101 08/17/2011 1750   CO2 24 08/17/2011 1750   GLUCOSE 97 08/17/2011 1750   BUN 20 08/17/2011 1750   CREATININE 0.65 08/17/2011 1750   CALCIUM 9.4 08/17/2011 1750   GFRNONAA >90 08/17/2011 1750   GFRAA >90 08/17/2011 1750    Medications:  Scheduled:     . ARIPiprazole  2 mg Oral BH-qamhs  . DULoxetine  60 mg Oral Daily  . ferrous sulfate  325 mg Oral TID WC  . gabapentin  300 mg Oral BH-[id number]  . lidocaine  1 patch Transdermal Q24H  . lisinopril  20 mg Oral Daily  . loperamide  4 mg Oral QID  . mirtazapine  15 mg Oral QHS  . pantoprazole  40 mg Oral Q1200     PRN Meds acetaminophen, albuterol, alum & mag hydroxide-simeth, ibuprofen, LORazepam, LORazepam, magnesium hydroxide, ondansetron, oxyCODONE-acetaminophen  Plan: may be moved to 500            No med adjustments today  Cheyeanne Roadcap,MICKIE D. 08/21/2011

## 2011-08-21 NOTE — Progress Notes (Signed)
[facility] Adult Inpatient Family/Significant Other Suicide Prevention Education  Suicide Prevention Education:  Education Completed; [name]-[phone](Pt.'s mother) has been identified by the patient as the family member/significant other with whom the patient will be residing, and identified as the person(s) who will aid the patient in the event of a mental health crisis (suicidal ideations/suicide attempt).  With written consent from the patient, the family member/significant other has been provided the following suicide prevention education, prior to the and/or following the discharge of the patient.  The suicide prevention education provided includes the following:  Suicide risk factors  Suicide prevention and interventions  National Suicide Hotline telephone number  [facility] assessment telephone number  [facility] Emergency Assistance 911  [facility] and/or Residential Mobile Crisis Unit telephone number  Request made of family/significant other to:  Remove weapons (e.g., guns, rifles, knives), all items previously/currently identified as safety concern. Pt.'s mother states there are no guns or weapons in the home.   Remove drugs/medications (over-the-counter, prescriptions, illicit drugs), all items previously/currently identified as a safety concern. Pt.'s mother will buy a lock box and lock up the pt.'s medication and monitor pt.'s medication with a pill organizer.  Pt.'s mother wants pt. to attend  AA support meetings and to states the pt. Does well when she takes her medication correctly. The  Pt.'s mother is supportive and wants the pt. To be well.   The family member/significant other verbalizes understanding of the suicide prevention education information provided.  The family member/significant other agrees to remove the items of safety concern listed above.  Lamar Blinks [place] 08/21/2011, 3:14 PM

## 2011-08-21 NOTE — Progress Notes (Signed)
Patient ID: Sharon Nicholson, female   DOB: 22-Oct-1960, 51 y.o.   MRN: [id number]   Pt was very tearful this morning regarding her son, she reported that she felt like her son was too good for her. Pt reported that she was having a bad day, and requested Ativan for her anxiety. Pt was informed that her ativan was being discontinued, pt was not happy. Pt then attempted to report that she has been taking Ativan for 13 years yet the medication was not documented on her H&P, Pt reported being negative SI/HI, no AH/VH noted.

## 2011-08-21 NOTE — Progress Notes (Signed)
Patient ID: Sharon Nicholson, female   DOB: January 14, 1961, 51 y.o.   MRN: [id number] Was out on hall and in dayroom, watched some tv tonight, attended group.  Was pleasant, rather quiet and guarded when she came to the med window, requested something for her nerves and was given ativan 1 mg.  Affect was rather flat but was making eye contact, seemed tired.  Will continue to monitor.

## 2011-08-21 NOTE — Progress Notes (Signed)
Patient ID: Sharon Nicholson, female   DOB: 09-12-1960, 51 y.o.   MRN: [id number]   [facility] Group Notes:  (Counselor/Nursing/MHT/Case Management/Adjunct)  08/21/2011 11 AM  Type of Therapy:  Group Therapy, Dance/Movement Therapy   Participation Level:  Did Not Attend   Kym Groom
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
## 2011-08-22 MED ORDER — LIDOCAINE 5 % EX PTCH
3.0000 | MEDICATED_PATCH | CUTANEOUS | Status: DC
  Administered 2011-08-23 – 2011-08-24 (×2): 3 via TRANSDERMAL
  Filled 2011-08-22 (×3): qty 3

## 2011-08-22 MED ORDER — MIRTAZAPINE 7.5 MG PO TABS: 7.5000 mg | ORAL_TABLET | Freq: Every day | ORAL | Status: DC

## 2011-08-22 MED ORDER — AMITRIPTYLINE HCL 25 MG PO TABS
25.0000 mg | ORAL_TABLET | Freq: Every day | ORAL | Status: DC
  Administered 2011-08-22 – 2011-08-23 (×2): 25 mg via ORAL
  Filled 2011-08-22 (×3): qty 1

## 2011-08-22 MED ORDER — ROPINIROLE HCL 1 MG PO TABS
1.0000 mg | ORAL_TABLET | Freq: Every day | ORAL | Status: DC
  Administered 2011-08-22 – 2011-08-23 (×2): 1 mg via ORAL
  Filled 2011-08-22 (×3): qty 1

## 2011-08-22 MED ORDER — ARIPIPRAZOLE 10 MG PO TABS
20.0000 mg | ORAL_TABLET | ORAL | Status: DC
  Administered 2011-08-22 – 2011-08-24 (×4): 20 mg via ORAL
  Filled 2011-08-22 (×6): qty 2

## 2011-08-22 NOTE — Progress Notes (Addendum)
Patient ID: Sharon Nicholson, female   DOB: [birth year], 51 y.o.   MRN: [id number] Pt seen in treatment team.  She was very interested in avoiding the use of Percoset.  She describes need for using Lidoderm patches on her knees as well as on her back.  This is so ordered.  She has trouble getting to sleep so Elavil was prescribed to help with her pain management and insomnia.  She has had restless legs on that before so reordered that.  Her mood disorder may not respond to 2 mg of Abilify, so I increased it to a dose more typical for treating bipolar 20 mg. The case manager is working on getting out patient therapy arranged for her.

## 2011-08-22 NOTE — Progress Notes (Signed)
Pt is pleasant and cooperative. Pt had no complaints. Pt attends groups and interacts well with peers and staff. Pt was offered support and encouragement. Pt is receptive to treatment and safety maintained on unit.
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
## 2011-08-22 NOTE — Tx Team (Addendum)
Interdisciplinary Treatment Plan Update (Adult)  Date:  08/22/2011  Time Reviewed:  10:49 AM   Progress in Treatment: Attending groups: Yes Participating in groups:  Yes Taking medication as prescribed: Yes Tolerating medication:  Yes Family/Significant othe contact made:  Yes, contact made with mother, [name] Patient understands diagnosis:  Yes Discussing patient identified problems/goals with staff:  Yes Medical problems stabilized or resolved:  Yes Denies suicidal/homicidal ideation: Yes Issues/concerns per patient self-inventory:  None identified Other: N/A  New problem(s) identified: None Identified  Reason for Continuation of Hospitalization: Anxiety Depression Medication stabilization  Interventions implemented related to continuation of hospitalization: mood stabilization, medication monitoring and adjustment, group therapy and psycho education, safety checks q 15 mins  Additional comments: N/A  Estimated length of stay: 3-5 days  Discharge Plan: Pt will be referred to a psychiatrist and therapist after pt terminates services with her current psyxchiatrist  New goal(s): N/A  Review of initial/current patient goals per problem list:    1.  Goal(s): Reduce depressive symptoms  Met:  No  Target date: by discharge  As evidenced by: Reducing depression from a 10 to a 3 as reported by pt.   2.  Goal (s): Reduce/Eliminate suicidal ideation  Met:  No  Target date: by discharge  As evidenced by: pt reporting no SI.    3.  Goal(s): Reduce anxiety symptoms  Met:  No  Target date: by discharge  As evidenced by: Reduce anxiety from a 10 to a 3 as reported by pt.    Attendees: Patient:  Sharon Nicholson 08/22/2011 11:29 AM   Family:     Physician:  Orson Aloe, MD  08/22/2011  10:49 AM   Nursing:   Quintella Reichert, RN 08/22/2011 10:52 AM   Case Manager:  Reyes Ivan, LCSWA 08/22/2011  10:49 AM   Counselor:  Angus Palms, LCSW 08/22/2011  10:49 AM     Other:  Juline Patch, LCSW 08/22/2011  10:49 AM   Other:  Serena Colonel, NP 08/22/2011 10:52 AM   Other:  Magdalen Spatz, counseling intern 08/22/2011 10:52 AM   Other:      Scribe for Treatment Team:   Carmina Miller, 08/22/2011 , 10:49 AM
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
## 2011-08-23 MED ORDER — OXYCODONE-ACETAMINOPHEN 5-325 MG PO TABS
1.0000 | ORAL_TABLET | Freq: Four times a day (QID) | ORAL | Status: DC | PRN
  Administered 2011-08-23 – 2011-08-24 (×3): 1 via ORAL
  Filled 2011-08-23 (×3): qty 1
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
## 2011-08-23 NOTE — Progress Notes (Addendum)
Pt attended discharge planning group and actively participated.  Pt presents with calm mood and affect.  Pt ranks depression and anxiety at a 1-2 today.  Pt denies SI.  Pt reports feeling much better today.  Pt states that she slept well last night and is feeling more stable and less depressed.  Pt states that she is wanting to d/c soon to care for her son at home.  Pt has follow up scheduled at [facility] for medication management and therapy.  Pt provided a bus pass for transportation home and will return to her own home.  Pt discussed the plan to interact with peers for more support and get out of the house more.  No further needs at this time.  Safety planning and suicide prevention discussed.     Reyes Ivan, LCSWA 08/23/2011  10:44 AM    Per State Regulation 482.30 This Chart was reviewed for medical necessity with respect to the patient's Admission/Duration of stay.   Carmina Miller  08/23/2011 Next Review Date:   08/25/11

## 2011-08-23 NOTE — Progress Notes (Signed)
[facility] Group Notes:  (Counselor/Nursing/MHT/Case Management/Adjunct)    Type of Therapy:  Group Therapy  Participation Level:  Did Not Attend       [name] 08/23/2011  12:44 PM

## 2011-08-23 NOTE — Progress Notes (Signed)
[facility] MD Progress Note  08/23/2011 9:12 AM  Diagnosis:  Axis I: Bipolar, Manic  ADL's:  Intact  Sleep: Good  Appetite:  Good  Suicidal Ideation:  Denies adamantly any suicidal thoughts. Homicidal Ideation:  Denies adamantly any homicidal thoughts.  Mental Status Examination/Evaluation: Objective:  Appearance: Casual  Eye Contact::  Good  Speech:  Clear and Coherent  Volume:  Normal  Mood:  2/10 on scale of 1 is the best and 10 is the worst Anxiety: 2/10 on the same scale  Affect:  Congruent  Thought Process:  Coherent  Orientation:  Full  Thought Content:  WDL  Suicidal Thoughts:  No  Homicidal Thoughts:  No  Memory:  Immediate;   Good Recent;   Good Remote;   Good  Judgement:  Good  Insight:  Good  Psychomotor Activity:  Normal  Concentration:  Good  Recall:  Good  Akathisia:  No  Handed:  Right  AIMS (if indicated):     Assets:  Communication Skills Desire for Improvement Financial Resources/Insurance Housing Leisure Time Resilience  Sleep:  Number of Hours: 5    Vital Signs:Blood pressure 151/94, pulse 78, temperature 98.2 F (36.8 C), temperature source Oral, resp. rate 18, height 5\' 3"  (1.6 m), weight 80.74 kg (178 lb). Current Medications: Current Facility-Administered Medications  Medication Dose Route Frequency Provider Last Rate Last Dose  . acetaminophen (TYLENOL) tablet 650 mg  650 mg Oral Q6H PRN Orson Aloe, MD      . albuterol (PROVENTIL HFA;VENTOLIN HFA) 108 (90 BASE) MCG/ACT inhaler 2 puff  2 puff Inhalation Q6H PRN Viviann Spare, NP      . alum & mag hydroxide-simeth (MAALOX/MYLANTA) 200-200-20 MG/5ML suspension 30 mL  30 mL Oral Q4H PRN Orson Aloe, MD      . amitriptyline (ELAVIL) tablet 25 mg  25 mg Oral QHS Orson Aloe, MD   25 mg at 08/22/11 2206  . ARIPiprazole (ABILIFY) tablet 20 mg  20 mg Oral BH-qamhs Orson Aloe, MD   20 mg at 08/23/11 0907  . DULoxetine (CYMBALTA) DR capsule 60 mg  60 mg Oral Daily Franchot Gallo, MD   60 mg  at 08/23/11 0906  . ferrous sulfate tablet 325 mg  325 mg Oral TID WC Franchot Gallo, MD   325 mg at 08/23/11 0616  . gabapentin (NEURONTIN) capsule 300 mg  300 mg Oral BH-[id number] Franchot Gallo, MD   300 mg at 08/23/11 0907  . ibuprofen (ADVIL,MOTRIN) tablet 800 mg  800 mg Oral Q8H PRN Orson Aloe, MD   800 mg at 08/23/11 0616  . lidocaine (LIDODERM) 5 % 3 patch  3 patch Transdermal Q24H Orson Aloe, MD      . lisinopril (PRINIVIL,ZESTRIL) tablet 20 mg  20 mg Oral Daily Franchot Gallo, MD   20 mg at 08/23/11 0908  . loperamide (IMODIUM) capsule 4 mg  4 mg Oral QID Franchot Gallo, MD   4 mg at 08/23/11 0908  . LORazepam (ATIVAN) tablet 0.5 mg  0.5 mg Oral TID PRN Orson Aloe, MD   0.5 mg at 08/22/11 2315  . magnesium hydroxide (MILK OF MAGNESIA) suspension 30 mL  30 mL Oral Daily PRN Orson Aloe, MD      . ondansetron (ZOFRAN-ODT) disintegrating tablet 8 mg  8 mg Oral Q8H PRN Viviann Spare, NP      . oxyCODONE-acetaminophen (PERCOCET) 5-325 MG per tablet 1 tablet  1 tablet Oral Q8H PRN Mickie D. Adams, PA   1 tablet at 08/23/11 0616  .  pantoprazole (PROTONIX) EC tablet 40 mg  40 mg Oral Q1200 Franchot Gallo, MD   40 mg at 08/22/11 1308  . rOPINIRole (REQUIP) tablet 1 mg  1 mg Oral QHS Orson Aloe, MD   1 mg at 08/22/11 2206  . DISCONTD: ARIPiprazole (ABILIFY) tablet 2 mg  2 mg Oral BH-qamhs Viviann Spare, NP   2 mg at 08/22/11 1610  . DISCONTD: lidocaine (LIDODERM) 5 % 1 patch  1 patch Transdermal Q24H Orson Aloe, MD   1 patch at 08/22/11 [phone]  . DISCONTD: mirtazapine (REMERON) tablet 15 mg  15 mg Oral QHS Franchot Gallo, MD   15 mg at 08/21/11 2144  . DISCONTD: mirtazapine (REMERON) tablet 7.5 mg  7.5 mg Oral QHS Orson Aloe, MD        Lab Results: No results found for this or any previous visit (from the past 48 hour(s)).  Physical Findings: AIMS:  , ,  ,  ,    CIWA:    COWS:     Treatment Plan Summary: Daily contact with patient to assess and evaluate symptoms and progress  in treatment Medication management  Plan: Apparently pt has histroy of osteoarthritis and now a very tender swelling on the posterior of the  L knee.  This seems consistent with a Baker's Cyst.  Pain management has been helpful, will order wheelchair to keep her off the knee.   Principal Problem:  *Major depressive disorder, recurrent episode Active Problems:  Alcohol dependence in remission  PTSD (post-traumatic stress disorder)    Filed Vitals:   08/23/11 0815  BP: 151/94  Pulse: 78  Temp:   Resp:     Lab Results:   BMET    Component Value Date/Time   NA 135 08/17/2011 1750   K 4.2 08/17/2011 1750   CL 101 08/17/2011 1750   CO2 24 08/17/2011 1750   GLUCOSE 97 08/17/2011 1750   BUN 20 08/17/2011 1750   CREATININE 0.65 08/17/2011 1750   CALCIUM 9.4 08/17/2011 1750   GFRNONAA >90 08/17/2011 1750   GFRAA >90 08/17/2011 1750    Medications:  Scheduled:     . amitriptyline  25 mg Oral QHS  . ARIPiprazole  20 mg Oral BH-qamhs  . DULoxetine  60 mg Oral Daily  . ferrous sulfate  325 mg Oral TID WC  . gabapentin  300 mg Oral BH-[id number]  . lidocaine  3 patch Transdermal Q24H  . lisinopril  20 mg Oral Daily  . loperamide  4 mg Oral QID  . pantoprazole  40 mg Oral Q1200  . rOPINIRole  1 mg Oral QHS  . DISCONTD: ARIPiprazole  2 mg Oral BH-qamhs  . DISCONTD: lidocaine  1 patch Transdermal Q24H  . DISCONTD: mirtazapine  15 mg Oral QHS  . DISCONTD: mirtazapine  7.5 mg Oral QHS     PRN Meds acetaminophen, albuterol, alum & mag hydroxide-simeth, ibuprofen, LORazepam, magnesium hydroxide, ondansetron, oxyCODONE-acetaminophen  Sharon Nicholson 08/23/2011

## 2011-08-23 NOTE — Progress Notes (Signed)
Pt reports depression as a 2 and hopeless as a 2. Pt c/o pain all over. Gave prn percocet and placed lidocaine patches. Pt is interacting and denies si and hi. Offered support, and 15 minute checks. Safety maintained.

## 2011-08-23 NOTE — Progress Notes (Signed)
[facility] Group Notes: (Counselor/Nursing/MHT/Case Management/Adjunct)  08/23/2011 @1 :15pm  Type of Therapy: Group Therapy   Participation Level: Active   Participation Quality: Attentive, Appropriate, Sharing   Affect: Appropriate   Cognitive: Appropriate   Insight: Good   Engagement in Group: Good   Engagement in Therapy: Limited   Modes of Intervention: Support and Exploration   Summary of Progress/Problems: Erza participated in mindful deep breathing and progressive muscle relaxation exercises. She processed how these exercises helped her to calm herself and clear her mind.    [name] 08/23/2011  3:43 PM
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
## 2011-08-23 NOTE — Progress Notes (Deleted)
Pt has been anxious, timid. Frequently comes to nurses station asking what to do. Also states "i don't know what i'm supposed to do". Pt has been positive for groups. C/o constipation. M.o.m. Given, without results. Also asks for "aspirin cream" to apply to bruise on left bruise. Pt was offered a cold pack, but refused. Pt denies s.i. Level 3 obs for safety. Support and reassurance provided. Pt receptive.

## 2011-08-24 MED ORDER — EPINEPHRINE 0.3 MG/0.3ML IJ DEVI: 0.3000 mg | Freq: Once | INTRAMUSCULAR | Status: DC

## 2011-08-24 MED ORDER — LIDOCAINE 5 % EX PTCH: 3.0000 | MEDICATED_PATCH | CUTANEOUS | Status: DC

## 2011-08-24 MED ORDER — ONDANSETRON 8 MG PO TBDP: 8.0000 mg | ORAL_TABLET | Freq: Three times a day (TID) | ORAL | Status: DC | PRN

## 2011-08-24 MED ORDER — ALBUTEROL SULFATE HFA 108 (90 BASE) MCG/ACT IN AERS: 2.0000 | INHALATION_SPRAY | Freq: Four times a day (QID) | RESPIRATORY_TRACT | Status: DC | PRN

## 2011-08-24 MED ORDER — GABAPENTIN 300 MG PO CAPS: 300.0000 mg | ORAL_CAPSULE | ORAL | Status: DC

## 2011-08-24 MED ORDER — AMITRIPTYLINE HCL 25 MG PO TABS: 25.0000 mg | ORAL_TABLET | Freq: Every day | ORAL | Status: DC

## 2011-08-24 MED ORDER — OMEPRAZOLE 40 MG PO CPDR: 40.0000 mg | DELAYED_RELEASE_CAPSULE | Freq: Every day | ORAL | Status: DC

## 2011-08-24 MED ORDER — LISINOPRIL 20 MG PO TABS: 20.0000 mg | ORAL_TABLET | Freq: Every day | ORAL | Status: DC

## 2011-08-24 MED ORDER — ARIPIPRAZOLE 20 MG PO TABS: 20.0000 mg | ORAL_TABLET | ORAL | Status: DC

## 2011-08-24 MED ORDER — DULOXETINE HCL 60 MG PO CPEP: 60.0000 mg | ORAL_CAPSULE | Freq: Every day | ORAL | Status: DC

## 2011-08-24 MED ORDER — LOPERAMIDE HCL 2 MG PO CAPS: 4.0000 mg | ORAL_CAPSULE | Freq: Four times a day (QID) | ORAL | Status: DC

## 2011-08-24 MED ORDER — ROPINIROLE HCL 1 MG PO TABS: 1.0000 mg | ORAL_TABLET | Freq: Every day | ORAL | Status: DC

## 2011-08-24 MED ORDER — FERROUS FUMARATE 325 (106 FE) MG PO TABS: 1.0000 | ORAL_TABLET | Freq: Three times a day (TID) | ORAL | Status: DC
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
## 2011-08-24 NOTE — Progress Notes (Signed)
[facility] Case Management Discharge Plan:  Will you be returning to the same living situation after discharge: Yes,  pt to return to own home At discharge, do you have transportation home?:Yes,  pt provided bus pass for transportation Do you have the ability to pay for your medications:Yes,  pt has access to meds  Interagency Information:   Release of information consent forms completed and in the chart; pt's signature needed at discharge.   Release of information consent forms completed and in the chart;  Patient's signature needed at discharge.  Patient to Follow up at:  Follow-up Information    Follow up with AA meetings.   Contact information:   see brochure for meetings and locations!      Follow up with [facility] on 09/01/2011. (Appointment scheduled at 3:30 pm with Dr. Dicky Doe)    Contact information:   [address], [place] [address] [phone]         Patient denies SI/HI:   Yes,  pt denies today    Safety Planning and Suicide Prevention discussed:  Yes,  discussed with pt  Barrier to discharge identified:No.  Summary and Recommendations: Pt attended discharge planning group and actively participated.  Pt presents with calm mood and affect.  Pt reports feeling stable to d/c today.  Pt reports being anxious to get home to care for her son and help her mom.  Pt ranks depression at a 2 and anxiety at a 0 today.  Pt denies SI.  Pt states that she feels her mood is much more stable and reports no anxiety.  No recommendations from SW.  No further needs voiced by pt.  Pt stable to discharge.     Carmina Miller 08/24/2011, 9:22 AM

## 2011-08-24 NOTE — Tx Team (Signed)
Interdisciplinary Treatment Plan Update (Adult)  Date:  08/24/2011  Time Reviewed:  10:12 AM   Progress in Treatment: Attending groups: Yes Participating in groups:  Yes Taking medication as prescribed: Yes Tolerating medication:  Yes Family/Significant othe contact made: Yes   Patient understands diagnosis:  Yes Discussing patient identified problems/goals with staff:  Yes Medical problems stabilized or resolved:  Yes Denies suicidal/homicidal ideation: Yes Issues/concerns per patient self-inventory:  None identified Other: N/A  New problem(s) identified: None Identified  Reason for Continuation of Hospitalization: Stable to d/c  Interventions implemented related to continuation of hospitalization: Stable to d/c  Additional comments: N/A  Estimated length of stay: D/C today  Discharge Plan: Pt will follow up at [facility] for medication management and therapy.    New goal(s): N/A  Review of initial/current patient goals per problem list:    1.  Goal(s): Reduce depressive symptoms  Met:  Yes  Target date: by discharge  As evidenced by: Reducing depression from a 10 to a 3 as reported by pt. Pt ranks depression at a 2 today.   2.  Goal (s): Reduce/Eliminate suicidal ideation  Met:  Yes  Target date: by discharge  As evidenced by: pt denies SI  3.  Goal(s): Reduce anxiety symptoms  Met:  Yes  Target date: by discharge  As evidenced by: Reduce anxiety from a 10 to a 3 as reported by pt.  Pt denies anxiety symptoms today.     Attendees: Patient:     Family:     Physician:  Orson Aloe, MD  08/24/2011  10:12 AM   Nursing:      Case Manager:  Reyes Ivan, LCSWA 08/24/2011  10:12 AM   Counselor:  Angus Palms, LCSW 08/24/2011  10:12 AM   Other:  Juline Patch, LCSW 08/24/2011  10:12 AM   Other:  Wilmon Arms, counseling intern 08/24/2011  10:12 AM   Other:  Magdalen Spatz, counseling intern 08/24/2011 10:15 AM   Other:      Scribe for Treatment  Team:   Carmina Miller, 08/24/2011 , 10:12 AM

## 2011-08-24 NOTE — Progress Notes (Signed)
Patient ID: Sharon Nicholson, female   DOB: 1960/11/21, 51 y.o.   MRN: [id number]  Pt was tearful during the assessment. Stated that her left knee was hurting worse than it had earlier in the day. Stated the pain pills were not helping as they had previously. Pts Dr was still on the unit and assessed the pt. Adjustment was made to her med. Support and encouragement was offered.

## 2011-08-25 NOTE — Progress Notes (Signed)
Patient Discharge Instructions:  Admission Note Faxed,  08/25/2011 After Visit Summary Faxed,  08/25/2011 Faxed to the Next Level Care provider:  08/25/2011 D/C Summary Note faxed 08/25/2011 Facesheet faxed 08/25/2011   Faxed to [facility] @ [phone]  Heloise Purpura Eduard Clos, 08/25/2011, 5:59 PM

## 2011-09-01 ENCOUNTER — Other Ambulatory Visit: Payer: Self-pay

## 2011-09-01 ENCOUNTER — Inpatient Hospital Stay (HOSPITAL_COMMUNITY)
Admission: EM | Admit: 2011-09-01 | Discharge: 2011-09-12 | DRG: 917 | Disposition: A | Discharge status: Other Acute Inpatient Hospital | Payer: Medicare Other | Attending: Internal Medicine | Admitting: Internal Medicine

## 2011-09-01 ENCOUNTER — Emergency Department (HOSPITAL_COMMUNITY): Payer: Medicare Other

## 2011-09-01 ENCOUNTER — Encounter (HOSPITAL_COMMUNITY): Payer: Self-pay

## 2011-09-01 DIAGNOSIS — F322 Major depressive disorder, single episode, severe without psychotic features: Secondary | ICD-10-CM

## 2011-09-01 DIAGNOSIS — T43501A Poisoning by unspecified antipsychotics and neuroleptics, accidental (unintentional), initial encounter: Secondary | ICD-10-CM | POA: Diagnosis present

## 2011-09-01 DIAGNOSIS — T50901A Poisoning by unspecified drugs, medicaments and biological substances, accidental (unintentional), initial encounter: Secondary | ICD-10-CM | POA: Diagnosis present

## 2011-09-01 DIAGNOSIS — I10 Essential (primary) hypertension: Secondary | ICD-10-CM | POA: Diagnosis present

## 2011-09-01 DIAGNOSIS — F339 Major depressive disorder, recurrent, unspecified: Secondary | ICD-10-CM | POA: Diagnosis present

## 2011-09-01 DIAGNOSIS — Z79899 Other long term (current) drug therapy: Secondary | ICD-10-CM

## 2011-09-01 DIAGNOSIS — T423X4A Poisoning by barbiturates, undetermined, initial encounter: Principal | ICD-10-CM | POA: Diagnosis present

## 2011-09-01 DIAGNOSIS — M329 Systemic lupus erythematosus, unspecified: Secondary | ICD-10-CM | POA: Diagnosis present

## 2011-09-01 DIAGNOSIS — IMO0002 Reserved for concepts with insufficient information to code with codable children: Secondary | ICD-10-CM | POA: Diagnosis not present

## 2011-09-01 DIAGNOSIS — T43294A Poisoning by other antidepressants, undetermined, initial encounter: Secondary | ICD-10-CM | POA: Diagnosis present

## 2011-09-01 DIAGNOSIS — R7402 Elevation of levels of lactic acid dehydrogenase (LDH): Secondary | ICD-10-CM | POA: Diagnosis present

## 2011-09-01 DIAGNOSIS — K766 Portal hypertension: Secondary | ICD-10-CM | POA: Diagnosis present

## 2011-09-01 DIAGNOSIS — Z885 Allergy status to narcotic agent status: Secondary | ICD-10-CM

## 2011-09-01 DIAGNOSIS — Z981 Arthrodesis status: Secondary | ICD-10-CM

## 2011-09-01 DIAGNOSIS — R7401 Elevation of levels of liver transaminase levels: Secondary | ICD-10-CM | POA: Diagnosis present

## 2011-09-01 DIAGNOSIS — T50992A Poisoning by other drugs, medicaments and biological substances, intentional self-harm, initial encounter: Secondary | ICD-10-CM | POA: Diagnosis present

## 2011-09-01 DIAGNOSIS — F1410 Cocaine abuse, uncomplicated: Secondary | ICD-10-CM | POA: Diagnosis present

## 2011-09-01 DIAGNOSIS — Z781 Physical restraint status: Secondary | ICD-10-CM | POA: Diagnosis present

## 2011-09-01 DIAGNOSIS — G92 Toxic encephalopathy: Secondary | ICD-10-CM | POA: Diagnosis present

## 2011-09-01 DIAGNOSIS — F489 Nonpsychotic mental disorder, unspecified: Secondary | ICD-10-CM

## 2011-09-01 DIAGNOSIS — R110 Nausea: Secondary | ICD-10-CM | POA: Diagnosis not present

## 2011-09-01 DIAGNOSIS — T438X2A Poisoning by other psychotropic drugs, intentional self-harm, initial encounter: Secondary | ICD-10-CM | POA: Diagnosis present

## 2011-09-01 DIAGNOSIS — R4182 Altered mental status, unspecified: Secondary | ICD-10-CM

## 2011-09-01 DIAGNOSIS — T391X1A Poisoning by 4-Aminophenol derivatives, accidental (unintentional), initial encounter: Secondary | ICD-10-CM | POA: Diagnosis present

## 2011-09-01 DIAGNOSIS — Z87442 Personal history of urinary calculi: Secondary | ICD-10-CM

## 2011-09-01 DIAGNOSIS — N6009 Solitary cyst of unspecified breast: Secondary | ICD-10-CM | POA: Diagnosis present

## 2011-09-01 DIAGNOSIS — R109 Unspecified abdominal pain: Secondary | ICD-10-CM | POA: Diagnosis present

## 2011-09-01 DIAGNOSIS — R1031 Right lower quadrant pain: Secondary | ICD-10-CM | POA: Diagnosis not present

## 2011-09-01 DIAGNOSIS — T43502A Poisoning by unspecified antipsychotics and neuroleptics, intentional self-harm, initial encounter: Secondary | ICD-10-CM | POA: Diagnosis present

## 2011-09-01 DIAGNOSIS — Y92009 Unspecified place in unspecified non-institutional (private) residence as the place of occurrence of the external cause: Secondary | ICD-10-CM

## 2011-09-01 DIAGNOSIS — T423X2A Poisoning by barbiturates, intentional self-harm, initial encounter: Secondary | ICD-10-CM | POA: Diagnosis present

## 2011-09-01 DIAGNOSIS — G929 Unspecified toxic encephalopathy: Secondary | ICD-10-CM | POA: Diagnosis present

## 2011-09-01 DIAGNOSIS — Z9884 Bariatric surgery status: Secondary | ICD-10-CM

## 2011-09-01 DIAGNOSIS — Z8614 Personal history of Methicillin resistant Staphylococcus aureus infection: Secondary | ICD-10-CM

## 2011-09-01 DIAGNOSIS — T394X2A Poisoning by antirheumatics, not elsewhere classified, intentional self-harm, initial encounter: Secondary | ICD-10-CM | POA: Diagnosis present

## 2011-09-01 DIAGNOSIS — F1910 Other psychoactive substance abuse, uncomplicated: Secondary | ICD-10-CM | POA: Diagnosis present

## 2011-09-01 DIAGNOSIS — T405X1A Poisoning by cocaine, accidental (unintentional), initial encounter: Secondary | ICD-10-CM | POA: Diagnosis present

## 2011-09-01 DIAGNOSIS — F1021 Alcohol dependence, in remission: Secondary | ICD-10-CM | POA: Diagnosis present

## 2011-09-01 DIAGNOSIS — T398X2A Poisoning by other nonopioid analgesics and antipyretics, not elsewhere classified, intentional self-harm, initial encounter: Secondary | ICD-10-CM | POA: Diagnosis present

## 2011-09-01 DIAGNOSIS — Z765 Malingerer [conscious simulation]: Secondary | ICD-10-CM

## 2011-09-01 DIAGNOSIS — E876 Hypokalemia: Secondary | ICD-10-CM | POA: Diagnosis present

## 2011-09-01 DIAGNOSIS — G9340 Encephalopathy, unspecified: Secondary | ICD-10-CM | POA: Diagnosis present

## 2011-09-01 LAB — RAPID URINE DRUG SCREEN, HOSP PERFORMED
Amphetamines: NOT DETECTED
Opiates: NOT DETECTED
Tetrahydrocannabinol: NOT DETECTED

## 2011-09-01 LAB — COMPREHENSIVE METABOLIC PANEL
BUN: 7 mg/dL (ref 6–23)
Calcium: 8.8 mg/dL (ref 8.4–10.5)
Creatinine, Ser: 0.71 mg/dL (ref 0.50–1.10)
GFR calc Af Amer: >90 mL/min (ref >90)
Glucose, Bld: 90 mg/dL (ref 70–99)
Total Protein: 6.6 g/dL (ref 6.0–8.3)

## 2011-09-01 LAB — URINALYSIS, ROUTINE W REFLEX MICROSCOPIC
Glucose, UA: NEGATIVE mg/dL
Hgb urine dipstick: NEGATIVE
Specific Gravity, Urine: 1.018 (ref 1.005–1.030)

## 2011-09-01 LAB — CBC
HCT: 43.2 % (ref 36.0–46.0)
MCV: 75.7 fL — ABNORMAL LOW (ref 78.0–100.0)
RBC: 5.71 MIL/uL — ABNORMAL HIGH (ref 3.87–5.11)
WBC: 6.7 10*3/uL (ref 4.0–10.5)

## 2011-09-01 LAB — DIFFERENTIAL
Eosinophils Relative: 1 % (ref 0–5)
Lymphocytes Relative: 20 % (ref 12–46)
Lymphs Abs: 1.3 10*3/uL (ref 0.7–4.0)
Monocytes Absolute: 0.3 10*3/uL (ref 0.1–1.0)
Monocytes Relative: 5 % (ref 3–12)

## 2011-09-01 LAB — PROTIME-INR: Prothrombin Time: 13 seconds (ref 11.6–15.2)

## 2011-09-01 LAB — POCT I-STAT, CHEM 8
Glucose, Bld: 103 mg/dL — ABNORMAL HIGH (ref 70–99)
HCT: 40 % (ref 36.0–46.0)
Hemoglobin: 13.6 g/dL (ref 12.0–15.0)
Potassium: 3.8 mEq/L (ref 3.5–5.1)
Sodium: 142 mEq/L (ref 135–145)
TCO2: 21 mmol/L (ref 0–100)

## 2011-09-01 LAB — ACETAMINOPHEN LEVEL: Acetaminophen (Tylenol), Serum: 25.1 ug/mL (ref 10–30)

## 2011-09-01 LAB — PREGNANCY, URINE: Preg Test, Ur: NEGATIVE

## 2011-09-01 LAB — MAGNESIUM: Magnesium: 2 mg/dL (ref 1.5–2.5)

## 2011-09-01 MED ORDER — SODIUM CHLORIDE 0.9 % IV BOLUS (SEPSIS)
500.0000 mL | Freq: Once | INTRAVENOUS | Status: AC
  Administered 2011-09-02: 12:00:00 via INTRAVENOUS

## 2011-09-01 MED ORDER — ACETYLCYSTEINE LOAD VIA INFUSION
150.0000 mg/kg | Freq: Once | INTRAVENOUS | Status: DC
  Administered 2011-09-01: 11220 mg via INTRAVENOUS
  Filled 2011-09-01: qty 281

## 2011-09-01 MED ORDER — DEXTROSE 5 % IV SOLN
15.0000 mg/kg/h | INTRAVENOUS | Status: DC
  Filled 2011-09-01: qty 200

## 2011-09-01 MED ORDER — METOCLOPRAMIDE HCL 5 MG/ML IJ SOLN: 10.0000 mg | Freq: Once | INTRAMUSCULAR | Status: DC

## 2011-09-01 MED ORDER — SODIUM CHLORIDE 0.9 % IV SOLN: INTRAVENOUS | Status: DC

## 2011-09-01 MED ORDER — NALOXONE HCL 1 MG/ML IJ SOLN
INTRAMUSCULAR | Status: AC
  Administered 2011-09-01: 14:00:00
  Filled 2011-09-01: qty 2

## 2011-09-01 NOTE — ED Notes (Signed)
Per EMS pt called "her key holder" and stated to him she was going to kill herself; pt found in her apartment lethargic with slurred speech but was alert. Pt states took unknown pills, a empty bottle of fioricet was found and needles laying around. Pt has obvious "track marks" to upper arms per EMS

## 2011-09-01 NOTE — ED Notes (Signed)
MD aware of repeat Tylenol level. No adjustment to Acetacysteline to be made.

## 2011-09-01 NOTE — ED Notes (Signed)
Gave pt a mouth swab and she refused it.

## 2011-09-01 NOTE — ED Notes (Signed)
AC aware the need of a Recruitment consultant

## 2011-09-01 NOTE — ED Notes (Signed)
Pt has uncontrollable verbal outburst.  Pt is threatening to leave. Contin

## 2011-09-01 NOTE — Progress Notes (Signed)
eLink Physician-Brief Progress Note Patient Name: Sharon Nicholson DOB: 1961-06-13 MRN: [id number]  Date of Service  09/01/2011   HPI/Events of Note  RN Autumn says tylenol level is 85   eICU Interventions  Spoke to pharmacy and will start n-acetylcysteine stat Check coags Move to ICU (order done) Check lactate   Intervention Category Major Interventions: Other: Intermediate Interventions: Diagnostic test evaluation;Change in mental status - evaluation and management  Novalee Horsfall 09/01/2011, 8:45 PM

## 2011-09-01 NOTE — ED Notes (Signed)
Pt continues to yell out requiring staff to return to bedside very frequently. Pt making attempts to get out of bed. Will continue to monitor.

## 2011-09-01 NOTE — ED Notes (Signed)
Unable to assess pt re incident.  Pt with slurred speech and incoherent at present.  Pt is teary and mumbles when speaking.

## 2011-09-01 NOTE — ED Notes (Signed)
EMS gave narcan 2mg s d/t in the truck pts respirations decreased to 10. Pt responded with painful stimuli.

## 2011-09-01 NOTE — ED Notes (Signed)
Pt. and belongings have both been wanded by security.  

## 2011-09-01 NOTE — ED Notes (Signed)
Patient yelling out at this time, in room multiple times to assist patient, pt mumbling. Seizure pads placed on stretcher for safety, VSS

## 2011-09-01 NOTE — ED Notes (Signed)
Friend at bedside with recording of patient he would like MD/ACT to hear. Friend will not wait at bedside, placed in consult room, ACT team and Dr. Effie Shy will join them to listen to recording.

## 2011-09-01 NOTE — ED Notes (Signed)
Pt continues to yell at staff, 2nd IV est, pt pulled IV out, repeating she wants to die. Pt rolling around on stretcher, unwilling to cooperate. Pt demanding food/beverage, explained to pt she is unable to have those things at this time. Pts foley cath became dislodged, pt verbalizes no discomfort. CN aware of need for safety sitter.

## 2011-09-01 NOTE — ED Notes (Signed)
Admitting MD at bedside.
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
## 2011-09-01 NOTE — ED Notes (Signed)
Pt stated that she had to use the bathroom.  After giving her a bed pan the pt. Stated that she didn't have to go.

## 2011-09-01 NOTE — ED Notes (Signed)
ZOX:WRUE<AV> Expected date:09/01/11<BR> Expected time: 1:16 PM<BR> Means of arrival:Ambulance<BR> Comments:<BR> EMS 31 GC, 52 yof SI, attempted OD, unknown substance

## 2011-09-01 NOTE — Progress Notes (Signed)
Initial visit with pt on referral from ED Nurse.  Pt referred for suicidal ideation / attempt.  Pt familiar to this chaplain from previous admission.  Pt in Resus B - lethargic,  Able to respond minimally to questions / conversation.  Complaining of having to urinate and having pain in abdominal region.  RN in room and acknowledged.  Pt has foley in place.  Pt informed chaplain that she is Jehovah's Witness, but did not wish for chaplain to contact Jehovah Witness resources outside the hospital.   Chaplain worked with pt on relaxation.  Pt responded to breathing exercises and talking about her son.  Was able to relax briefly, then would awaken and complain of pain.    Will continue to follow in ED and revisit to assess for needs as pt. moves to the floor    Please page as needs arise.      09/01/11 1800  Clinical Encounter Type  Visited With Patient  Visit Type Initial;ED;Spiritual support;Psychological support;Social support  Referral From Nurse  Consult/Referral To Nurse;Chaplain  Spiritual Encounters  Spiritual Needs Emotional;Grief support  Stress Factors  Patient Stress Factors Other (Comment);Family relationships
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
## 2011-09-02 DIAGNOSIS — T6592XA Toxic effect of unspecified substance, intentional self-harm, initial encounter: Secondary | ICD-10-CM

## 2011-09-02 LAB — COMPREHENSIVE METABOLIC PANEL
ALT: 34 U/L (ref 0–35)
AST: 57 U/L — ABNORMAL HIGH (ref 0–37)
Albumin: 3.2 g/dL — ABNORMAL LOW (ref 3.5–5.2)
Albumin: 2.5 g/dL — ABNORMAL LOW (ref 3.5–5.2)
Alkaline Phosphatase: 100 U/L (ref 39–117)
BUN: 9 mg/dL (ref 6–23)
BUN: 9 mg/dL (ref 6–23)
CO2: 17 mEq/L — ABNORMAL LOW (ref 19–32)
CO2: 16 mEq/L — ABNORMAL LOW (ref 19–32)
Calcium: 8 mg/dL — ABNORMAL LOW (ref 8.4–10.5)
Creatinine, Ser: 0.69 mg/dL (ref 0.50–1.10)
Creatinine, Ser: 0.65 mg/dL (ref 0.50–1.10)
GFR calc Af Amer: >90 mL/min (ref >90)
GFR calc Af Amer: >90 mL/min (ref >90)
GFR calc non Af Amer: >90 mL/min (ref >90)
GFR calc non Af Amer: >90 mL/min (ref >90)
Glucose, Bld: 118 mg/dL — ABNORMAL HIGH (ref 70–99)
Glucose, Bld: 102 mg/dL — ABNORMAL HIGH (ref 70–99)
Potassium: 3.2 mEq/L — ABNORMAL LOW (ref 3.5–5.1)
Sodium: 137 mEq/L (ref 135–145)
Total Bilirubin: 0.2 mg/dL — ABNORMAL LOW (ref 0.3–1.2)
Total Protein: 6.7 g/dL (ref 6.0–8.3)
Total Protein: 6.1 g/dL (ref 6.0–8.3)
Total Protein: 5.6 g/dL — ABNORMAL LOW (ref 6.0–8.3)

## 2011-09-02 LAB — URINE CULTURE
Colony Count: NO GROWTH
Culture  Setup Time: 201302080108
Culture: NO GROWTH

## 2011-09-02 LAB — PROTIME-INR
INR: 1.05 (ref 0.00–1.49)
Prothrombin Time: 13.9 seconds (ref 11.6–15.2)

## 2011-09-02 LAB — MRSA PCR SCREENING: MRSA by PCR: NEGATIVE

## 2011-09-02 MED ORDER — LORAZEPAM 2 MG/ML IJ SOLN
2.0000 mg | Freq: Once | INTRAMUSCULAR | Status: AC
Start: 2011-09-02 — End: 2011-09-02
  Administered 2011-09-02: 2 mg via INTRAVENOUS

## 2011-09-02 MED ORDER — SODIUM CHLORIDE 0.9 % IV SOLN
INTRAVENOUS | Status: DC
  Administered 2011-09-02 – 2011-09-04 (×5): via INTRAVENOUS

## 2011-09-02 MED ORDER — LORAZEPAM 2 MG/ML IJ SOLN
1.0000 mg | Freq: Once | INTRAMUSCULAR | Status: AC
  Administered 2011-09-02: 17:00:00 via INTRAVENOUS
  Filled 2011-09-02: qty 1

## 2011-09-02 MED ORDER — LORAZEPAM 2 MG/ML IJ SOLN
2.0000 mg | Freq: Once | INTRAMUSCULAR | Status: AC
  Administered 2011-09-02: 2 mg via INTRAVENOUS
  Filled 2011-09-02: qty 1

## 2011-09-02 MED ORDER — THIAMINE HCL 100 MG/ML IJ SOLN
100.0000 mg | Freq: Every day | INTRAMUSCULAR | Status: DC
  Administered 2011-09-03: 100 mg via INTRAVENOUS
  Filled 2011-09-02 (×2): qty 2

## 2011-09-02 MED ORDER — LORAZEPAM 2 MG/ML IJ SOLN
2.0000 mg | Freq: Once | INTRAMUSCULAR | Status: AC
  Administered 2011-09-02: 2 mg via INTRAVENOUS

## 2011-09-02 MED ORDER — SODIUM CHLORIDE 0.9 % IV BOLUS (SEPSIS)
1000.0000 mL | Freq: Once | INTRAVENOUS | Status: AC
  Administered 2011-09-02: 1000 mL via INTRAVENOUS

## 2011-09-02 MED ORDER — PANTOPRAZOLE SODIUM 40 MG IV SOLR
40.0000 mg | INTRAVENOUS | Status: DC
  Filled 2011-09-02: qty 40

## 2011-09-02 MED ORDER — ALBUTEROL SULFATE (5 MG/ML) 0.5% IN NEBU: 2.5000 mg | INHALATION_SOLUTION | RESPIRATORY_TRACT | Status: DC | PRN

## 2011-09-02 MED ORDER — LABETALOL HCL 5 MG/ML IV SOLN
10.0000 mg | INTRAVENOUS | Status: DC | PRN
  Filled 2011-09-02: qty 4

## 2011-09-02 MED ORDER — LORAZEPAM 2 MG/ML IJ SOLN
INTRAMUSCULAR | Status: AC
  Filled 2011-09-02: qty 1

## 2011-09-02 MED ORDER — POTASSIUM CHLORIDE 10 MEQ/100ML IV SOLN
10.0000 meq | INTRAVENOUS | Status: AC
  Administered 2011-09-02 (×4): 10 meq via INTRAVENOUS
  Filled 2011-09-02 (×3): qty 100

## 2011-09-02 MED ORDER — ZIPRASIDONE MESYLATE 20 MG IM SOLR
20.0000 mg | Freq: Once | INTRAMUSCULAR | Status: AC
  Administered 2011-09-02: 20 mg via INTRAMUSCULAR
  Filled 2011-09-02: qty 20

## 2011-09-02 MED ORDER — POTASSIUM CHLORIDE 10 MEQ/100ML IV SOLN
INTRAVENOUS | Status: AC
  Administered 2011-09-02: 10 meq via INTRAVENOUS
  Filled 2011-09-02: qty 100

## 2011-09-02 MED ORDER — DEXMEDETOMIDINE HCL 100 MCG/ML IV SOLN
0.4000 ug/kg/h | INTRAVENOUS | Status: DC
  Filled 2011-09-02: qty 2

## 2011-09-02 MED ORDER — FOLIC ACID 5 MG/ML IJ SOLN
1.0000 mg | Freq: Every day | INTRAMUSCULAR | Status: DC
  Administered 2011-09-02 – 2011-09-03 (×2): 1 mg via INTRAVENOUS
  Filled 2011-09-02 (×3): qty 0.2

## 2011-09-02 NOTE — ED Notes (Signed)
Pt continues to be in restraints x 4. Pt continues to yell, and state she wants to die. Conditions of release explained to pt. Pt verbalized understanding .

## 2011-09-02 NOTE — ED Notes (Signed)
MD at bedside. 
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
## 2011-09-02 NOTE — ED Notes (Signed)
Pt. Was given water at RN request

## 2011-09-02 NOTE — Progress Notes (Signed)
Drew up [employer] and obtained MD signatures.  Faxed document to [employer].  Confirmed receipt of IVC papers by [employer].  Providence Crosby, LCSWA Clinical Social Work [phone]

## 2011-09-02 NOTE — Progress Notes (Signed)
Addition. Patient is Jehovah's Witness but when asked about whether she would want a blood transfusion, she replied, "Yes. I want my life saved." JW's have other resources for transfusions. At another times, when she is more coherent, the discussion of what should would and would not want might be worth pursuing with her.

## 2011-09-02 NOTE — ED Notes (Signed)
CBG 127 
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
## 2011-09-02 NOTE — Progress Notes (Signed)
Patient febrile overnight.  No WBC, U/A clean and CXR normal.  No indication for abx at this point.  Will send blood cultures and reassess.  Will need psych in AM.  Very difficult to work with overnight sedation wise.  Alyson Reedy, M.D. [facility] Pulmonary/Critical Care Medicine. Pager: [phone]. After hours pager: [phone].
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
## 2011-09-02 NOTE — ED Notes (Signed)
Pt has finished eating sandwich.  Repositioned in the bed. Lights out and calming techniques used to help pt try and relax.  Sitter remains at the b/s to observe the pt.

## 2011-09-02 NOTE — ED Notes (Signed)
Pt requested water.  RN allowed the patient to have of water.

## 2011-09-02 NOTE — ED Notes (Signed)
Pt continues to fight with staff, scream, kick. Pt removed bilat hand IVs with her teeth. IV restarted in R dorsal foot. Pt threatening staff.

## 2011-09-02 NOTE — Progress Notes (Signed)
Called by bedside RN, patient is complaining of 10/10 pain but BP is 130 and HR is 90.  She is asking specifically for dilaudid since that is the only thing that "works for her."  She is also asking for ativan.  When camera was turned on, patient is sound asleep.  She is clearly not in pain and is drug seeking.  We will not offer any narcotics or benzos unless evidence of withdrawal is present.  Patient is not to leave AMA either since she is a danger to herself and her son.    Will also D/C mucomyst since patient's tylenol level is not normal.  Alyson Reedy, M.D. [facility] Pulmonary/Critical Care Medicine. Pager: [phone]. After hours pager: [phone].

## 2011-09-02 NOTE — Progress Notes (Signed)
Offered support. 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 She does not want a church elder contacted though.  Will follow up to offer emotional support.  09/02/11 1000  Clinical Encounter Type  Visited With Patient;Health care provider  Visit Type Spiritual support  Referral From Nurse  Recommendations Follow up   Spiritual Encounters  Spiritual Needs Emotional

## 2011-09-02 NOTE — ED Notes (Signed)
Restraints removed at 11:15

## 2011-09-03 DIAGNOSIS — F3189 Other bipolar disorder: Secondary | ICD-10-CM

## 2011-09-03 DIAGNOSIS — F1920 Other psychoactive substance dependence, uncomplicated: Secondary | ICD-10-CM

## 2011-09-03 LAB — CBC
HCT: 34.9 % — ABNORMAL LOW (ref 36.0–46.0)
Hemoglobin: 11.1 g/dL — ABNORMAL LOW (ref 12.0–15.0)
MCV: 74.5 fL — ABNORMAL LOW (ref 78.0–100.0)
MCV: 75.4 fL — ABNORMAL LOW (ref 78.0–100.0)
Platelets: 193 10*3/uL (ref 150–400)
RBC: 4.44 MIL/uL (ref 3.87–5.11)
RDW: 20.4 % — ABNORMAL HIGH (ref 11.5–15.5)
WBC: 4.2 10*3/uL (ref 4.0–10.5)
WBC: 3.6 10*3/uL — ABNORMAL LOW (ref 4.0–10.5)

## 2011-09-03 LAB — COMPREHENSIVE METABOLIC PANEL
ALT: 34 U/L (ref 0–35)
AST: 61 U/L — ABNORMAL HIGH (ref 0–37)
Albumin: 2.6 g/dL — ABNORMAL LOW (ref 3.5–5.2)
Alkaline Phosphatase: 93 U/L (ref 39–117)
BUN: 8 mg/dL (ref 6–23)
CO2: 19 mEq/L (ref 19–32)
CO2: 18 mEq/L — ABNORMAL LOW (ref 19–32)
Calcium: 7.9 mg/dL — ABNORMAL LOW (ref 8.4–10.5)
Chloride: 111 mEq/L (ref 96–112)
Chloride: 110 mEq/L (ref 96–112)
Creatinine, Ser: 0.64 mg/dL (ref 0.50–1.10)
Creatinine, Ser: 0.56 mg/dL (ref 0.50–1.10)
GFR calc non Af Amer: >90 mL/min (ref >90)
GFR calc non Af Amer: >90 mL/min (ref >90)
Glucose, Bld: 113 mg/dL — ABNORMAL HIGH (ref 70–99)
Potassium: 3.5 mEq/L (ref 3.5–5.1)
Sodium: 139 mEq/L (ref 135–145)
Total Bilirubin: 0.1 mg/dL — ABNORMAL LOW (ref 0.3–1.2)
Total Bilirubin: 0.1 mg/dL — ABNORMAL LOW (ref 0.3–1.2)

## 2011-09-03 LAB — PROTIME-INR: INR: 1 (ref 0.00–1.49)

## 2011-09-03 MED ORDER — OXYCODONE-ACETAMINOPHEN 5-325 MG PO TABS
1.0000 | ORAL_TABLET | Freq: Three times a day (TID) | ORAL | Status: DC | PRN
  Administered 2011-09-03 – 2011-09-05 (×6): 1 via ORAL
  Filled 2011-09-03 (×7): qty 1

## 2011-09-03 MED ORDER — IBUPROFEN 800 MG PO TABS
800.0000 mg | ORAL_TABLET | Freq: Three times a day (TID) | ORAL | Status: DC
  Administered 2011-09-03 – 2011-09-08 (×15): 800 mg via ORAL
  Filled 2011-09-03 (×20): qty 1

## 2011-09-03 MED ORDER — LORAZEPAM 0.5 MG PO TABS
0.5000 mg | ORAL_TABLET | Freq: Once | ORAL | Status: AC
  Administered 2011-09-04: 0.5 mg via ORAL
  Filled 2011-09-03: qty 1

## 2011-09-03 MED ORDER — PANTOPRAZOLE SODIUM 40 MG PO TBEC
40.0000 mg | DELAYED_RELEASE_TABLET | Freq: Every day | ORAL | Status: DC
  Administered 2011-09-03: 40 mg via ORAL
  Filled 2011-09-03 (×2): qty 1

## 2011-09-03 MED ORDER — METOPROLOL SUCCINATE 12.5 MG HALF TABLET
12.5000 mg | ORAL_TABLET | Freq: Every day | ORAL | Status: DC
  Administered 2011-09-03 – 2011-09-04 (×2): 12.5 mg via ORAL
  Filled 2011-09-03 (×2): qty 1

## 2011-09-03 MED ORDER — DIVALPROEX SODIUM 250 MG PO DR TAB
250.0000 mg | DELAYED_RELEASE_TABLET | Freq: Two times a day (BID) | ORAL | Status: DC
  Administered 2011-09-03 – 2011-09-05 (×4): 250 mg via ORAL
  Filled 2011-09-03 (×7): qty 1

## 2011-09-03 MED ORDER — LORAZEPAM 0.5 MG PO TABS
0.5000 mg | ORAL_TABLET | Freq: Two times a day (BID) | ORAL | Status: DC
  Administered 2011-09-03 – 2011-09-05 (×5): 0.5 mg via ORAL
  Filled 2011-09-03 (×5): qty 1
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
## 2011-09-03 NOTE — Progress Notes (Signed)
PROGRESS NOTE  Sharon Nicholson ZOX:[id number] DOB: 1961/05/16 DOA: 09/01/2011 PCP: No primary provider on file.  Brief narrative: 51 year old female admitted to/7/13 with intentional drug overdose. Reportedly took 8 Fioricet and Seroquel and Elavil Noted urine drug screen positive for cocaine and barbiturates  Past medical history: Htn, Kidney stones, lupus, Suicide attempt on Naprocen, Alcoholism, H/o (per patient) of multiple back surgeries  Consultants:  Patient accepted from critical care 09/03/11  Psychiatry consult called to [phone]  Procedures:  Chest x-ray 09/01/11 = no acute cardiopulmonary abnormalities  Urine culture = no growth  Tylenol level on admission 85-dropped to less than 15 09/01/48  Antibiotics:  None   Subjective  Requesting pain medicine. Tearful.   Objective   Interim History: Chart reviewed  Objective: Filed Vitals:   09/03/11 0200 09/03/11 0400 09/03/11 0600 09/03/11 0800  BP: 129/76 138/74 120/59 173/81  Pulse: 83 82 78 82  Temp:  98.1 F (36.7 C)  98.5 F (36.9 C)  TempSrc:  Oral  Oral  Resp: 25 19 24 20   Height:      Weight:      SpO2: 99% 100% 99% 99%    Intake/Output Summary (Last 24 hours) at 09/03/11 0911 Last data filed at 09/03/11 0900  Gross per 24 hour  Intake   2710 ml  Output   2450 ml  Net    260 ml    Exam:  General: Flat affect, disheveled appearance, tearful Cardiovascular: S1-S2 no murmur rub or gallop  Respiratory: Clinically clear no added sound  Abdomen: Soft nontender nondistended  Skin tattoos in lower back Neuro tearful. Power equal in all 4 limbs grossly  Data Reviewed: Basic Metabolic Panel:  Lab 09/03/11 8119 09/03/11 0050 09/02/11 1850 09/02/11 1135 09/02/11 0350 09/01/11 1745  NA 139 138 137 136 137 --  K 3.7 3.5 -- -- -- --  CL 110 111 110 105 104 --  CO2 18* 19 16* 17* 19 --  GLUCOSE 102* 113* 133* 102* 118* --  BUN 5* 8 9 9 9  --  CREATININE 0.56 0.64 0.65 0.72 0.69 --  CALCIUM 7.9* 7.8* 8.0*  8.7 8.9 --  MG -- -- -- -- -- 2.0  PHOS -- -- -- -- -- 5.1*   Liver Function Tests:  Lab 09/03/11 0500 09/03/11 0050 09/02/11 1850 09/02/11 1135 09/02/11 0350  AST 61* 59* 57* 42* 43*  ALT 34 33 34 35 39*  ALKPHOS 91 93 91 100 106  BILITOT 0.1* 0.1* 0.1* 0.2* 0.2*  PROT 5.6* 5.5* 5.6* 6.1 6.7  ALBUMIN 2.6* 2.6* 2.5* 2.9* 3.2*   No results found for this basename: LIPASE:5,AMYLASE:5 in the last 168 hours  Lab 09/01/11 1436  AMMONIA 37   CBC:  Lab 09/03/11 0845 09/03/11 0050 09/01/11 1615 09/01/11 1435  WBC 3.6* 4.2 -- 6.7  NEUTROABS -- -- -- 5.0  HGB 11.1* 10.5* 13.6 13.5  HCT 34.9* 33.1* 40.0 43.2  MCV 75.4* 74.5* -- 75.7*  PLT 198 193 -- 281   Cardiac Enzymes: No results found for this basename: CKTOTAL:5,CKMB:5,CKMBINDEX:5,TROPONINI:5 in the last 168 hours BNP: No components found with this basename: POCBNP:5 CBG:  Lab 09/02/11 0455  GLUCAP 127*    Recent Results (from the past 240 hour(s))  URINE CULTURE     Status: Normal   Collection Time   09/01/11  2:09 PM      Component Value Range Status Comment   Specimen Description URINE, CATHETERIZED   Final    Special Requests NONE  Final    Culture  Setup Time [id number]   Final    Colony Count NO GROWTH   Final    Culture NO GROWTH   Final    Report Status 09/02/2011 FINAL   Final   MRSA PCR SCREENING     Status: Normal   Collection Time   09/02/11  6:45 PM      Component Value Range Status Comment   MRSA by PCR NEGATIVE  NEGATIVE  Final      Studies: Ct Head Wo Contrast  08/18/2011  *RADIOLOGY REPORT*  Clinical Data: Overdose with altered mental status, slurred speech and unsteady gait.  CT HEAD WITHOUT CONTRAST  Technique:  Contiguous axial images were obtained from the base of the skull through the vertex without contrast.  Comparison: None.  Findings: No evidence of acute infarct, acute hemorrhage, mass lesion, mass effect or hydrocephalus.  Visualized portions of the paranasal sinuses show scattered  mucosal thickening.  No air fluid levels.  Mastoid air cells are clear.  No fracture.  IMPRESSION:  No acute intracranial abnormality.  Original Report Authenticated By: Reyes Ivan, M.D.   Dg Chest Port 1 View  09/01/2011  *RADIOLOGY REPORT*  Clinical Data: Weakness.  Altered mental status.  PORTABLE CHEST - 1 VIEW  Comparison: None.  Findings: The cardiomediastinal silhouette is within normal limits. Lungs are under aerated and clear.  No pneumothorax or pleural effusion.  IMPRESSION: No active cardiopulmonary disease.  Original Report Authenticated By: Donavan Burnet, M.D.   Dg Knee Complete 4 Views Left  08/18/2011  *RADIOLOGY REPORT*  Clinical Data: Fall with anterior knee pain and laceration.  LEFT KNEE - COMPLETE 4+ VIEW  Comparison: None.  Findings: Osteopenia.  No acute osseous abnormality.  No joint effusion.  Patellofemoral osteophytosis.  IMPRESSION: No joint effusion or fracture.  Osteopenia.  Original Report Authenticated By: Reyes Ivan, M.D.    Scheduled Meds:   . folic acid  1 mg Intravenous Daily  . LORazepam  1 mg Intravenous Once  . metoCLOPramide (REGLAN) injection  10 mg Intravenous Once  . pantoprazole (PROTONIX) IV  40 mg Intravenous Q24H  . potassium chloride  10 mEq Intravenous Q1 Hr x 4  . sodium chloride  500 mL Intravenous Once  . thiamine  100 mg Intravenous Daily   Continuous Infusions:   . sodium chloride    . sodium chloride 125 mL/hr at 09/03/11 0200  . DISCONTD: acetylcysteine 14.866 mg/kg/hr (09/02/11 0204)  . DISCONTD: dexmedetomidine (PRECEDEX) IV infusion       Assessment/Plan: 1. Suicidal attempt-psychiatry consulted this morning.  Notes similar issue occurred 08/19/11 which took 15 of her son's Fioricet. She was admitted to behavioral health 1/24. She is medically clear from my standpoint to transfer to a psychiatry facility. I would hesitate to place her on any by mouth medications on discharge as she has overdose not once but twice and  would recommend an injection/depot form anxiolytic or antidepressant--she states she's taken temazepam in the past for sleep and takes Ativan for anxiety and I will give her a limited dose of those in the hospital until psychiatry can make a plan for 2. Back pain-history of L4-L5 fusion and loss of disc height on x-ray 05/15/1999 -critical care medicine as noted that she might have drug-seeking behavior. At this time we will control her pain with ibuprofen and low doses of opiate medications. She should not be discharged on these medications will need multiple pain therapy for the 3.  Tylenol toxicity/overdose-her Tylenol level is less than 15 and she does not require any further aggressive monitoring-at present only her AST is slightly elevated and her INR is 1.00. I would discontinue labs and IV fluids at present time 4. Polysubstance abuse-she's positive for cocaine barbiturates and will need followup for the same 5. Encephalopathy-secondary to medications-resolved 6. Alcohol dependence in remission-per psychiatry 7. Elevated blood pressure-continue when necessary labetalol. May need to add low-dose beta blocker-added Toprol-XL 12.5 daily 8. Elevated temperature-possibly likely secondary to overdose. No evidence of systemic infection, urine culture negative  Code Status: full  Disposition Plan: Will need inpatient psychiatry management  Pleas Koch, MD  [facility] Pager [phone] 09/03/2011, 9:11 AM    LOS: 2 days

## 2011-09-04 MED ORDER — LISINOPRIL 20 MG PO TABS
20.0000 mg | ORAL_TABLET | Freq: Every day | ORAL | Status: DC
  Administered 2011-09-04 – 2011-09-07 (×4): 20 mg via ORAL
  Filled 2011-09-04 (×4): qty 1

## 2011-09-04 MED ORDER — ALBUTEROL SULFATE HFA 108 (90 BASE) MCG/ACT IN AERS
2.0000 | INHALATION_SPRAY | Freq: Four times a day (QID) | RESPIRATORY_TRACT | Status: DC | PRN
  Administered 2011-09-06 – 2011-09-11 (×2): 2 via RESPIRATORY_TRACT
  Filled 2011-09-04: qty 6.7

## 2011-09-04 MED ORDER — VITAMIN B-1 100 MG PO TABS
100.0000 mg | ORAL_TABLET | Freq: Every day | ORAL | Status: DC
  Administered 2011-09-04 – 2011-09-12 (×9): 100 mg via ORAL
  Filled 2011-09-04 (×10): qty 1

## 2011-09-04 MED ORDER — PANTOPRAZOLE SODIUM 40 MG PO TBEC
40.0000 mg | DELAYED_RELEASE_TABLET | Freq: Every day | ORAL | Status: DC
  Administered 2011-09-04 – 2011-09-10 (×7): 40 mg via ORAL
  Filled 2011-09-04 (×8): qty 1

## 2011-09-04 MED ORDER — FERROUS FUMARATE 325 (106 FE) MG PO TABS
1.0000 | ORAL_TABLET | Freq: Three times a day (TID) | ORAL | Status: DC
  Administered 2011-09-04 – 2011-09-12 (×26): 106 mg via ORAL
  Filled 2011-09-04 (×30): qty 1

## 2011-09-04 MED ORDER — FOLIC ACID 1 MG PO TABS
1.0000 mg | ORAL_TABLET | Freq: Every day | ORAL | Status: DC
  Administered 2011-09-04 – 2011-09-12 (×9): 1 mg via ORAL
  Filled 2011-09-04 (×10): qty 1

## 2011-09-04 MED ORDER — ROPINIROLE HCL 1 MG PO TABS
1.0000 mg | ORAL_TABLET | Freq: Every day | ORAL | Status: DC
  Administered 2011-09-04 – 2011-09-11 (×8): 1 mg via ORAL
  Filled 2011-09-04 (×10): qty 1

## 2011-09-04 NOTE — Progress Notes (Signed)
Pharmacy: IV to PO P&T policy  Thiamine and Folic Acid have been changed from the IV formulation to PO as patient is tolerating other PO medications and a regular diet - Per P&T IV to PO policy  Yudith Norlander, Loma Messing PharmD 9:56 AM 09/04/2011

## 2011-09-04 NOTE — Progress Notes (Signed)
PROGRESS NOTE  Sharon Nicholson ZOX:[id number] DOB: 1961-05-25 DOA: 09/01/2011 PCP: No primary provider on file.  Brief narrative: 51 year old female admitted to/7/13 with intentional drug overdose. Reportedly took 8 Fioricet and Seroquel and Elavil Noted urine drug screen positive for cocaine and barbiturates  Past medical history: Htn, Kidney stones, lupus, Suicide attempt on Naprocen, Alcoholism, H/o (per patient) of multiple back surgeries  Consultants:  Patient accepted from critical care 09/03/11  Psychiatry consult called to [phone]  Procedures:  Chest x-ray 09/01/11 = no acute cardiopulmonary abnormalities  Urine culture = no growth  Tylenol level on admission 85-dropped to less than 15 09/01/48  Antibiotics:  None   Subjective  Wanted to have a Requip restarted today.   Objective   Interim History: Chart reviewed  Objective: Filed Vitals:   09/03/11 2130 09/03/11 2330 09/04/11 0548 09/04/11 0737  BP: 132/77 139/81 156/88   Pulse: 73 74 67   Temp: 98.6 F (37 C) 97.4 F (36.3 C) 97.6 F (36.4 C)   TempSrc: Oral Oral Oral   Resp: 16 20 18    Height:    5\' 4"  (1.626 m)  Weight:    77 kg (169 lb 12.1 oz)  SpO2: 99% 100% 100%     Intake/Output Summary (Last 24 hours) at 09/04/11 0944 Last data filed at 09/04/11 0746  Gross per 24 hour  Intake   1560 ml  Output   3700 ml  Net  -2140 ml    Exam:  General: Flat affect, disheveled appearance, tearful Cardiovascular: S1-S2 no murmur rub or gallop  Respiratory: Clinically clear no added sound  Abdomen: Soft nontender nondistended  Skin tattoos in lower back Neuro tearful. Power equal in all 4 limbs grossly  Data Reviewed: Basic Metabolic Panel:  Lab 09/03/11 8119 09/03/11 0050 09/02/11 1850 09/02/11 1135 09/02/11 0350 09/01/11 1745  NA 139 138 137 136 137 --  K 3.7 3.5 -- -- -- --  CL 110 111 110 105 104 --  CO2 18* 19 16* 17* 19 --  GLUCOSE 102* 113* 133* 102* 118* --  BUN 5* 8 9 9 9  --  CREATININE  0.56 0.64 0.65 0.72 0.69 --  CALCIUM 7.9* 7.8* 8.0* 8.7 8.9 --  MG -- -- -- -- -- 2.0  PHOS -- -- -- -- -- 5.1*   Liver Function Tests:  Lab 09/03/11 0500 09/03/11 0050 09/02/11 1850 09/02/11 1135 09/02/11 0350  AST 61* 59* 57* 42* 43*  ALT 34 33 34 35 39*  ALKPHOS 91 93 91 100 106  BILITOT 0.1* 0.1* 0.1* 0.2* 0.2*  PROT 5.6* 5.5* 5.6* 6.1 6.7  ALBUMIN 2.6* 2.6* 2.5* 2.9* 3.2*   No results found for this basename: LIPASE:5,AMYLASE:5 in the last 168 hours  Lab 09/01/11 1436  AMMONIA 37   CBC:  Lab 09/03/11 0845 09/03/11 0050 09/01/11 1615 09/01/11 1435  WBC 3.6* 4.2 -- 6.7  NEUTROABS -- -- -- 5.0  HGB 11.1* 10.5* 13.6 13.5  HCT 34.9* 33.1* 40.0 43.2  MCV 75.4* 74.5* -- 75.7*  PLT 198 193 -- 281   Cardiac Enzymes: No results found for this basename: CKTOTAL:5,CKMB:5,CKMBINDEX:5,TROPONINI:5 in the last 168 hours BNP: No components found with this basename: POCBNP:5 CBG:  Lab 09/02/11 0455  GLUCAP 127*    Recent Results (from the past 240 hour(s))  URINE CULTURE     Status: Normal   Collection Time   09/01/11  2:09 PM      Component Value Range Status Comment   Specimen Description URINE,  CATHETERIZED   Final    Special Requests NONE   Final    Culture  Setup Time [id number]   Final    Colony Count NO GROWTH   Final    Culture NO GROWTH   Final    Report Status 09/02/2011 FINAL   Final   CULTURE, BLOOD (ROUTINE X 2)     Status: Normal (Preliminary result)   Collection Time   09/02/11  4:25 AM      Component Value Range Status Comment   Specimen Description BLOOD RIGHT FOREARM   Final    Special Requests BOTTLES DRAWN AEROBIC AND ANAEROBIC 9CC   Final    Culture  Setup Time [id number]   Final    Culture     Final    Value:        BLOOD CULTURE RECEIVED NO GROWTH TO DATE CULTURE WILL BE HELD FOR 5 DAYS BEFORE ISSUING A FINAL NEGATIVE REPORT   Report Status PENDING   Incomplete   CULTURE, BLOOD (ROUTINE X 2)     Status: Normal (Preliminary result)   Collection  Time   09/02/11  4:25 AM      Component Value Range Status Comment   Specimen Description BLOOD RIGHT HAND   Final    Special Requests BOTTLES DRAWN AEROBIC ONLY 5CC   Final    Culture  Setup Time [id number]   Final    Culture     Final    Value:        BLOOD CULTURE RECEIVED NO GROWTH TO DATE CULTURE WILL BE HELD FOR 5 DAYS BEFORE ISSUING A FINAL NEGATIVE REPORT   Report Status PENDING   Incomplete   MRSA PCR SCREENING     Status: Normal   Collection Time   09/02/11  6:45 PM      Component Value Range Status Comment   MRSA by PCR NEGATIVE  NEGATIVE  Final      Studies: Ct Head Wo Contrast  08/18/2011  *RADIOLOGY REPORT*  Clinical Data: Overdose with altered mental status, slurred speech and unsteady gait.  CT HEAD WITHOUT CONTRAST  Technique:  Contiguous axial images were obtained from the base of the skull through the vertex without contrast.  Comparison: None.  Findings: No evidence of acute infarct, acute hemorrhage, mass lesion, mass effect or hydrocephalus.  Visualized portions of the paranasal sinuses show scattered mucosal thickening.  No air fluid levels.  Mastoid air cells are clear.  No fracture.  IMPRESSION:  No acute intracranial abnormality.  Original Report Authenticated By: Reyes Ivan, M.D.   Dg Chest Port 1 View  09/01/2011  *RADIOLOGY REPORT*  Clinical Data: Weakness.  Altered mental status.  PORTABLE CHEST - 1 VIEW  Comparison: None.  Findings: The cardiomediastinal silhouette is within normal limits. Lungs are under aerated and clear.  No pneumothorax or pleural effusion.  IMPRESSION: No active cardiopulmonary disease.  Original Report Authenticated By: Donavan Burnet, M.D.   Dg Knee Complete 4 Views Left  08/18/2011  *RADIOLOGY REPORT*  Clinical Data: Fall with anterior knee pain and laceration.  LEFT KNEE - COMPLETE 4+ VIEW  Comparison: None.  Findings: Osteopenia.  No acute osseous abnormality.  No joint effusion.  Patellofemoral osteophytosis.  IMPRESSION: No joint  effusion or fracture.  Osteopenia.  Original Report Authenticated By: Reyes Ivan, M.D.    Scheduled Meds:    . divalproex  250 mg Oral Q12H  . folic acid  1 mg Intravenous Daily  . ibuprofen  800 mg Oral TID  . LORazepam  0.5 mg Oral BID  . LORazepam  0.5 mg Oral Once  . metoprolol succinate  12.5 mg Oral Daily  . pantoprazole  40 mg Oral Q1200  . thiamine  100 mg Intravenous Daily  . DISCONTD: metoCLOPramide (REGLAN) injection  10 mg Intravenous Once  . DISCONTD: pantoprazole (PROTONIX) IV  40 mg Intravenous Q24H   Continuous Infusions:    . sodium chloride 125 mL/hr at 09/04/11 0151  . DISCONTD: sodium chloride       Assessment/Plan: 1. Suicidal attempt-possible transfer to behavioral health tomorrow. 2. Back pain-history of L4-L5 fusion and loss of disc height on x-ray 05/15/1999 3. Tylenol toxicity/overdose-her Tylenol level is less than 15 and she does not require any further aggressive monitoring- 4. Polysubstance abuse-she's positive for cocaine barbiturates and will need followup for the same 5. Encephalopathy-secondary to medications-resolved 6. Alcohol dependence in remission-per psychiatry 7. Elevated blood pressure-continue when necessary labetalol. May need to add low-dose beta blocker-added Toprol-XL 12.5 daily 8. Elevated temperature-possibly likely secondary to overdose. No evidence of systemic infection, urine culture negative. resolved  Code Status: full  Disposition Plan: Will need inpatient psychiatry management  Molli Posey, MD  [facility] Pager [phone] 09/04/2011, 9:44 AM    LOS: 3 days

## 2011-09-04 NOTE — Progress Notes (Addendum)
  Sharon Nicholson is a 51 y.o. female [id number] 14-Feb-1961  Subjective/Objective: Patient seen today. She continued to endorse suicidal thinking but more calm and maintain better eye contact. She liked depakote as it helped however she continued to endorse poor sleep and racing thoughts. She denies any side effects of medication. She is tolerating the medication. Patient remains suicidal and risk to harm herself. Her attention and concentration remains poor and distracted at times. Though she denies any homicidal thoughts towards her brother but she also does not want to talk about. She denies any auditory or visual hallucination. There is no paranoia or delusions present. She's alert and oriented x3.   Filed Vitals:   09/04/11 1451  BP: 133/84  Pulse: 70  Temp: 97.8 F (36.6 C)  Resp: 18    Lab Results:   BMET    Component Value Date/Time   NA 139 09/03/2011 0500   K 3.7 09/03/2011 0500   CL 110 09/03/2011 0500   CO2 18* 09/03/2011 0500   GLUCOSE 102* 09/03/2011 0500   BUN 5* 09/03/2011 0500   CREATININE 0.56 09/03/2011 0500   CALCIUM 7.9* 09/03/2011 0500   GFRNONAA >90 09/03/2011 0500   GFRAA >90 09/03/2011 0500    Medications:  Scheduled:     . divalproex  250 mg Oral Q12H  . ferrous fumarate  1 tablet Oral TID  . folic acid  1 mg Oral Daily  . ibuprofen  800 mg Oral TID  . lisinopril  20 mg Oral Daily  . LORazepam  0.5 mg Oral BID  . LORazepam  0.5 mg Oral Once  . pantoprazole  40 mg Oral Q1200  . rOPINIRole  1 mg Oral QHS  . thiamine  100 mg Oral Daily  . DISCONTD: folic acid  1 mg Intravenous Daily  . DISCONTD: metoprolol succinate  12.5 mg Oral Daily  . DISCONTD: pantoprazole  40 mg Oral Q1200  . DISCONTD: thiamine  100 mg Intravenous Daily     PRN Meds albuterol, labetalol, oxyCODONE-acetaminophen, DISCONTD: albuterol  Assessment Bipolar disorder with psychotic features, polysubstance dependence  Plan Continue sitter for suicidal thinking. Consider titrating  Depakote to 250 in the morning and 500 at bedtime if medically not contraindicated. Consultation liaison service will follow this patient.

## 2011-09-05 ENCOUNTER — Inpatient Hospital Stay (HOSPITAL_COMMUNITY): Payer: Medicare Other

## 2011-09-05 DIAGNOSIS — R109 Unspecified abdominal pain: Secondary | ICD-10-CM | POA: Diagnosis present

## 2011-09-05 LAB — COMPREHENSIVE METABOLIC PANEL
AST: 82 U/L — ABNORMAL HIGH (ref 0–37)
Albumin: 2.7 g/dL — ABNORMAL LOW (ref 3.5–5.2)
BUN: 3 mg/dL — ABNORMAL LOW (ref 6–23)
Calcium: 8.4 mg/dL (ref 8.4–10.5)
Creatinine, Ser: 0.51 mg/dL (ref 0.50–1.10)
Total Bilirubin: 0.1 mg/dL — ABNORMAL LOW (ref 0.3–1.2)
Total Protein: 5.7 g/dL — ABNORMAL LOW (ref 6.0–8.3)

## 2011-09-05 LAB — VALPROIC ACID LEVEL: Valproic Acid Lvl: 18 ug/mL — ABNORMAL LOW (ref 50.0–100.0)

## 2011-09-05 LAB — CBC
HCT: 34 % — ABNORMAL LOW (ref 36.0–46.0)
MCH: 23.2 pg — ABNORMAL LOW (ref 26.0–34.0)
MCHC: 31.2 g/dL (ref 30.0–36.0)
MCV: 74.6 fL — ABNORMAL LOW (ref 78.0–100.0)
Platelets: 212 10*3/uL (ref 150–400)
RDW: 20.7 % — ABNORMAL HIGH (ref 11.5–15.5)
WBC: 5.1 10*3/uL (ref 4.0–10.5)

## 2011-09-05 MED ORDER — DIVALPROEX SODIUM 250 MG PO DR TAB
250.0000 mg | DELAYED_RELEASE_TABLET | Freq: Every day | ORAL | Status: DC
  Filled 2011-09-05: qty 1

## 2011-09-05 MED ORDER — ZOLPIDEM TARTRATE 10 MG PO TABS
10.0000 mg | ORAL_TABLET | Freq: Every evening | ORAL | Status: DC | PRN
  Administered 2011-09-05 – 2011-09-11 (×7): 10 mg via ORAL
  Filled 2011-09-05 (×7): qty 1

## 2011-09-05 MED ORDER — IOHEXOL 300 MG/ML  SOLN: 80.0000 mL | Freq: Once | INTRAMUSCULAR | Status: AC | PRN

## 2011-09-05 MED ORDER — IOHEXOL 300 MG/ML  SOLN
80.0000 mL | Freq: Once | INTRAMUSCULAR | Status: AC | PRN
  Administered 2011-09-05: 80 mL via INTRAVENOUS

## 2011-09-05 MED ORDER — BENZTROPINE MESYLATE 0.5 MG PO TABS
0.5000 mg | ORAL_TABLET | Freq: Two times a day (BID) | ORAL | Status: DC
  Administered 2011-09-05 – 2011-09-12 (×14): 0.5 mg via ORAL
  Filled 2011-09-05 (×17): qty 1

## 2011-09-05 MED ORDER — OXYCODONE HCL 5 MG PO TABS
5.0000 mg | ORAL_TABLET | Freq: Once | ORAL | Status: AC
  Administered 2011-09-05: 5 mg via ORAL
  Filled 2011-09-05: qty 1

## 2011-09-05 MED ORDER — DIVALPROEX SODIUM 500 MG PO DR TAB
500.0000 mg | DELAYED_RELEASE_TABLET | Freq: Every day | ORAL | Status: DC
  Filled 2011-09-05 (×2): qty 1

## 2011-09-05 MED ORDER — RISPERIDONE 3 MG PO TABS
3.0000 mg | ORAL_TABLET | Freq: Every day | ORAL | Status: DC
  Administered 2011-09-05 – 2011-09-11 (×7): 3 mg via ORAL
  Filled 2011-09-05 (×10): qty 1

## 2011-09-05 MED ORDER — DIVALPROEX SODIUM 250 MG PO DR TAB
250.0000 mg | DELAYED_RELEASE_TABLET | Freq: Every day | ORAL | Status: DC
  Administered 2011-09-05 – 2011-09-11 (×7): 250 mg via ORAL
  Filled 2011-09-05 (×9): qty 1

## 2011-09-05 MED ORDER — OXYCODONE HCL 5 MG PO TABS
5.0000 mg | ORAL_TABLET | ORAL | Status: DC | PRN
  Administered 2011-09-05 – 2011-09-12 (×34): 5 mg via ORAL
  Filled 2011-09-05 (×37): qty 1

## 2011-09-05 NOTE — Progress Notes (Signed)
Faxed Pt's information to [facility].  CSW to continue to follow.  Providence Crosby, LCSWA Clinical Social Work [phone]

## 2011-09-05 NOTE — Progress Notes (Signed)
Subjective: Complains of Abdominal Pain  Objective: Weight change:   Intake/Output Summary (Last 24 hours) at 09/05/11 1604 Last data filed at 09/05/11 1257  Gross per 24 hour  Intake   2040 ml  Output   3375 ml  Net  -1335 ml    Filed Vitals:   09/05/11 1402  BP: 129/66  Pulse: 65  Temp: 98.5 F (36.9 C)  Resp: 20   Gen:NAD CV:RRR LUNGS:CTA bilaterally ABD: Soft mildly tender, positive BS Ext:no edema  Lab Results: reviewed  Micro Results: Recent Results (from the past 240 hour(s))  URINE CULTURE     Status: Normal   Collection Time   09/01/11  2:09 PM      Component Value Range Status Comment   Specimen Description URINE, CATHETERIZED   Final    Special Requests NONE   Final    Culture  Setup Time [id number]   Final    Colony Count NO GROWTH   Final    Culture NO GROWTH   Final    Report Status 09/02/2011 FINAL   Final   CULTURE, BLOOD (ROUTINE X 2)     Status: Normal (Preliminary result)   Collection Time   09/02/11  4:25 AM      Component Value Range Status Comment   Specimen Description BLOOD RIGHT FOREARM   Final    Special Requests BOTTLES DRAWN AEROBIC AND ANAEROBIC 9CC   Final    Culture  Setup Time [id number]   Final    Culture     Final    Value:        BLOOD CULTURE RECEIVED NO GROWTH TO DATE CULTURE WILL BE HELD FOR 5 DAYS BEFORE ISSUING A FINAL NEGATIVE REPORT   Report Status PENDING   Incomplete   CULTURE, BLOOD (ROUTINE X 2)     Status: Normal (Preliminary result)   Collection Time   09/02/11  4:25 AM      Component Value Range Status Comment   Specimen Description BLOOD RIGHT HAND   Final    Special Requests BOTTLES DRAWN AEROBIC ONLY 5CC   Final    Culture  Setup Time [id number]   Final    Culture     Final    Value:        BLOOD CULTURE RECEIVED NO GROWTH TO DATE CULTURE WILL BE HELD FOR 5 DAYS BEFORE ISSUING A FINAL NEGATIVE REPORT   Report Status PENDING   Incomplete   MRSA PCR SCREENING     Status: Normal   Collection Time   09/02/11  6:45 PM      Component Value Range Status Comment   MRSA by PCR NEGATIVE  NEGATIVE  Final     Studies/Results: Ct Head Wo Contrast  08/18/2011  *RADIOLOGY REPORT*  Clinical Data: Overdose with altered mental status, slurred speech and unsteady gait.  CT HEAD WITHOUT CONTRAST  Technique:  Contiguous axial images were obtained from the base of the skull through the vertex without contrast.  Comparison: None.  Findings: No evidence of acute infarct, acute hemorrhage, mass lesion, mass effect or hydrocephalus.  Visualized portions of the paranasal sinuses show scattered mucosal thickening.  No air fluid levels.  Mastoid air cells are clear.  No fracture.  IMPRESSION:  No acute intracranial abnormality.  Original Report Authenticated By: Reyes Ivan, M.D.   Dg Chest Port 1 View  09/01/2011  *RADIOLOGY REPORT*  Clinical Data: Weakness.  Altered mental status.  PORTABLE CHEST - 1 VIEW  Comparison:  None.  Findings: The cardiomediastinal silhouette is within normal limits. Lungs are under aerated and clear.  No pneumothorax or pleural effusion.  IMPRESSION: No active cardiopulmonary disease.  Original Report Authenticated By: Donavan Burnet, M.D.   Dg Knee Complete 4 Views Left  08/18/2011  *RADIOLOGY REPORT*  Clinical Data: Fall with anterior knee pain and laceration.  LEFT KNEE - COMPLETE 4+ VIEW  Comparison: None.  Findings: Osteopenia.  No acute osseous abnormality.  No joint effusion.  Patellofemoral osteophytosis.  IMPRESSION: No joint effusion or fracture.  Osteopenia.  Original Report Authenticated By: Reyes Ivan, M.D.   Medications: Scheduled Meds:   . divalproex  250 mg Oral Daily  . divalproex  500 mg Oral QHS  . ferrous fumarate  1 tablet Oral TID  . folic acid  1 mg Oral Daily  . ibuprofen  800 mg Oral TID  . lisinopril  20 mg Oral Daily  . LORazepam  0.5 mg Oral BID  . oxyCODONE  5 mg Oral Once  . pantoprazole  40 mg Oral Q1200  . rOPINIRole  1 mg Oral QHS  .  thiamine  100 mg Oral Daily  . DISCONTD: divalproex  250 mg Oral Q12H   Continuous Infusions:  PRN Meds:.albuterol, labetalol, oxyCODONE, DISCONTD: oxyCODONE-acetaminophen  Assessment/Plan: Drug overdose (09/01/2011)/Suicide Attempt To inpatient Psych facility,  Major depressive disorder, recurrent episode (08/19/2011) Per Psych recommendations.  Encephalopathy (09/01/2011) Resolved.  Abdominal pain (09/05/2011) Exam benign but liver studies are elevated.  Get CT of Abdomen.    LOS: 4 days   Sharon Nicholson 09/05/2011, 4:04 PM
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
## 2011-09-05 NOTE — Progress Notes (Signed)
Junious Dresser at [facility] requesting ASA and Tylenol levels, Progress Notes, EKG and MAR.  Faxed the aforementioned documents to [place].  Providence Crosby, LCSWA Clinical Social Work [phone]

## 2011-09-05 NOTE — Progress Notes (Signed)
Agreement above note.
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
## 2011-09-06 DIAGNOSIS — F329 Major depressive disorder, single episode, unspecified: Secondary | ICD-10-CM

## 2011-09-06 DIAGNOSIS — T423X4A Poisoning by barbiturates, undetermined, initial encounter: Principal | ICD-10-CM

## 2011-09-06 DIAGNOSIS — T423X2A Poisoning by barbiturates, intentional self-harm, initial encounter: Secondary | ICD-10-CM

## 2011-09-06 LAB — URINALYSIS, ROUTINE W REFLEX MICROSCOPIC
Glucose, UA: NEGATIVE mg/dL
Ketones, ur: NEGATIVE mg/dL
Leukocytes, UA: NEGATIVE
Nitrite: NEGATIVE
Protein, ur: NEGATIVE mg/dL
pH: 6.5 (ref 5.0–8.0)

## 2011-09-06 NOTE — Progress Notes (Signed)
Faxed Tylenol, ASA and Depakote level to State.  CSW to continue to follow.  Providence Crosby, LCSWA Clinical Social Work [phone]
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
## 2011-09-06 NOTE — Progress Notes (Signed)
.s Subjective: Patient states that her abdominal pain is much improved. She thinks that it is a pulled muscle.  Objective: Weight change: -3.2 kg (-7 lb 0.9 oz)  Intake/Output Summary (Last 24 hours) at 09/06/11 1434 Last data filed at 09/06/11 0830  Gross per 24 hour  Intake    480 ml  Output   1000 ml  Net   -520 ml    Filed Vitals:   09/06/11 1342  BP: 132/84  Pulse: 75  Temp: 97.8 F (36.6 C)  Resp: 18   Gen:NAD CV:RRR LUNGS:CTA bilaterally ABD: Soft mildly tender, positive BS Ext:no edema  Lab Results: reviewed  Micro Results: Recent Results (from the past 240 hour(s))  URINE CULTURE     Status: Normal   Collection Time   09/01/11  2:09 PM      Component Value Range Status Comment   Specimen Description URINE, CATHETERIZED   Final    Special Requests NONE   Final    Culture  Setup Time [id number]   Final    Colony Count NO GROWTH   Final    Culture NO GROWTH   Final    Report Status 09/02/2011 FINAL   Final   CULTURE, BLOOD (ROUTINE X 2)     Status: Normal (Preliminary result)   Collection Time   09/02/11  4:25 AM      Component Value Range Status Comment   Specimen Description BLOOD RIGHT FOREARM   Final    Special Requests BOTTLES DRAWN AEROBIC AND ANAEROBIC 9CC   Final    Culture  Setup Time [id number]   Final    Culture     Final    Value:        BLOOD CULTURE RECEIVED NO GROWTH TO DATE CULTURE WILL BE HELD FOR 5 DAYS BEFORE ISSUING A FINAL NEGATIVE REPORT   Report Status PENDING   Incomplete   CULTURE, BLOOD (ROUTINE X 2)     Status: Normal (Preliminary result)   Collection Time   09/02/11  4:25 AM      Component Value Range Status Comment   Specimen Description BLOOD RIGHT HAND   Final    Special Requests BOTTLES DRAWN AEROBIC ONLY 5CC   Final    Culture  Setup Time [id number]   Final    Culture     Final    Value:        BLOOD CULTURE RECEIVED NO GROWTH TO DATE CULTURE WILL BE HELD FOR 5 DAYS BEFORE ISSUING A FINAL NEGATIVE REPORT   Report  Status PENDING   Incomplete   MRSA PCR SCREENING     Status: Normal   Collection Time   09/02/11  6:45 PM      Component Value Range Status Comment   MRSA by PCR NEGATIVE  NEGATIVE  Final     Studies/Results: Ct Head Wo Contrast  08/18/2011  *RADIOLOGY REPORT*  Clinical Data: Overdose with altered mental status, slurred speech and unsteady gait.  CT HEAD WITHOUT CONTRAST  Technique:  Contiguous axial images were obtained from the base of the skull through the vertex without contrast.  Comparison: None.  Findings: No evidence of acute infarct, acute hemorrhage, mass lesion, mass effect or hydrocephalus.  Visualized portions of the paranasal sinuses show scattered mucosal thickening.  No air fluid levels.  Mastoid air cells are clear.  No fracture.  IMPRESSION:  No acute intracranial abnormality.  Original Report Authenticated By: Reyes Ivan, M.D.   Dg Chest Port 1  View  09/01/2011  *RADIOLOGY REPORT*  Clinical Data: Weakness.  Altered mental status.  PORTABLE CHEST - 1 VIEW  Comparison: None.  Findings: The cardiomediastinal silhouette is within normal limits. Lungs are under aerated and clear.  No pneumothorax or pleural effusion.  IMPRESSION: No active cardiopulmonary disease.  Original Report Authenticated By: Donavan Burnet, M.D.   Dg Knee Complete 4 Views Left  08/18/2011  *RADIOLOGY REPORT*  Clinical Data: Fall with anterior knee pain and laceration.  LEFT KNEE - COMPLETE 4+ VIEW  Comparison: None.  Findings: Osteopenia.  No acute osseous abnormality.  No joint effusion.  Patellofemoral osteophytosis.  IMPRESSION: No joint effusion or fracture.  Osteopenia.  Original Report Authenticated By: Reyes Ivan, M.D.   Medications: Scheduled Meds:    . benztropine  0.5 mg Oral BID  . divalproex  250 mg Oral QHS  . ferrous fumarate  1 tablet Oral TID  . folic acid  1 mg Oral Daily  . ibuprofen  800 mg Oral TID  . lisinopril  20 mg Oral Daily  . pantoprazole  40 mg Oral Q1200  .  risperiDONE  3 mg Oral QHS  . rOPINIRole  1 mg Oral QHS  . thiamine  100 mg Oral Daily  . DISCONTD: divalproex  250 mg Oral Q12H  . DISCONTD: divalproex  250 mg Oral Daily  . DISCONTD: divalproex  500 mg Oral QHS  . DISCONTD: LORazepam  0.5 mg Oral BID   Continuous Infusions:  PRN Meds:.albuterol, iohexol, iohexol, labetalol, oxyCODONE, zolpidem, DISCONTD: oxyCODONE-acetaminophen  Assessment/Plan: Drug overdose (09/01/2011)/Suicide Attempt To inpatient Psych facility,  Major depressive disorder, recurrent episode (08/19/2011) Per Psych recommendations.  Encephalopathy (09/01/2011) Resolved.  Abdominal pain (09/05/2011) Exam benign but liver studies are elevated most likely secondary to her Tylenol overdose. Will continue to monitor , check urinalysis.   Transaminitis As mentioned above multifactorial secondary to Tylenol patient stated she also has chronic alcohol use in the past. Will monitor liver function.   [facility]     LOS: 5 days   Sharon Nicholson 09/06/2011, 2:34 PM

## 2011-09-07 LAB — CBC
HCT: 36.1 % (ref 36.0–46.0)
Hemoglobin: 11.1 g/dL — ABNORMAL LOW (ref 12.0–15.0)
MCHC: 30.7 g/dL (ref 30.0–36.0)
RDW: 20.4 % — ABNORMAL HIGH (ref 11.5–15.5)
WBC: 6 10*3/uL (ref 4.0–10.5)

## 2011-09-07 LAB — COMPREHENSIVE METABOLIC PANEL
ALT: 48 U/L — ABNORMAL HIGH (ref 0–35)
Albumin: 2.7 g/dL — ABNORMAL LOW (ref 3.5–5.2)
Alkaline Phosphatase: 99 U/L (ref 39–117)
BUN: 5 mg/dL — ABNORMAL LOW (ref 6–23)
Chloride: 106 mEq/L (ref 96–112)
Potassium: 4.2 mEq/L (ref 3.5–5.1)
Sodium: 138 mEq/L (ref 135–145)
Total Bilirubin: 0.2 mg/dL — ABNORMAL LOW (ref 0.3–1.2)
Total Protein: 5.7 g/dL — ABNORMAL LOW (ref 6.0–8.3)

## 2011-09-07 MED ORDER — LISINOPRIL 40 MG PO TABS
40.0000 mg | ORAL_TABLET | Freq: Every day | ORAL | Status: DC
  Administered 2011-09-08 – 2011-09-12 (×5): 40 mg via ORAL
  Filled 2011-09-07 (×6): qty 1

## 2011-09-07 MED ORDER — LOPERAMIDE HCL 2 MG PO CAPS
2.0000 mg | ORAL_CAPSULE | ORAL | Status: DC | PRN
Start: 2011-09-07 — End: 2011-09-11
  Administered 2011-09-07 – 2011-09-08 (×4): 2 mg via ORAL
  Filled 2011-09-07 (×4): qty 1

## 2011-09-07 NOTE — Progress Notes (Signed)
Informed by State that Pt still on the waiting list.  CSW to continue to follow.  Providence Crosby, LCSWA Clinical Social Work [phone]
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
## 2011-09-07 NOTE — Progress Notes (Signed)
.s Subjective: Patient states that her abdominal pain is much improved. She thinks that it is a pulled muscle.  She feels much better now that she knows that her disabled son is staying with her Mother.  She feels that now she can focus more on herself.  Objective: Weight change: 0.5 kg (1 lb 1.6 oz)  Intake/Output Summary (Last 24 hours) at 09/07/11 2148 Last data filed at 09/07/11 1356  Gross per 24 hour  Intake   1680 ml  Output      0 ml  Net   1680 ml    Filed Vitals:   09/07/11 2107  BP: 148/77  Pulse: 67  Temp: 98.6 F (37 C)  Resp: 16   Gen:NAD CV:RRR LUNGS:CTA bilaterally ABD: Soft mildly tender, positive BS Ext:no edema  Lab Results: reviewed  Micro Results: Recent Results (from the past 240 hour(s))  URINE CULTURE     Status: Normal   Collection Time   09/01/11  2:09 PM      Component Value Range Status Comment   Specimen Description URINE, CATHETERIZED   Final    Special Requests NONE   Final    Culture  Setup Time [id number]   Final    Colony Count NO GROWTH   Final    Culture NO GROWTH   Final    Report Status 09/02/2011 FINAL   Final   CULTURE, BLOOD (ROUTINE X 2)     Status: Normal (Preliminary result)   Collection Time   09/02/11  4:25 AM      Component Value Range Status Comment   Specimen Description BLOOD RIGHT FOREARM   Final    Special Requests BOTTLES DRAWN AEROBIC AND ANAEROBIC 9CC   Final    Culture  Setup Time [id number]   Final    Culture     Final    Value:        BLOOD CULTURE RECEIVED NO GROWTH TO DATE CULTURE WILL BE HELD FOR 5 DAYS BEFORE ISSUING A FINAL NEGATIVE REPORT   Report Status PENDING   Incomplete   CULTURE, BLOOD (ROUTINE X 2)     Status: Normal (Preliminary result)   Collection Time   09/02/11  4:25 AM      Component Value Range Status Comment   Specimen Description BLOOD RIGHT HAND   Final    Special Requests BOTTLES DRAWN AEROBIC ONLY 5CC   Final    Culture  Setup Time [id number]   Final    Culture     Final    Value:        BLOOD CULTURE RECEIVED NO GROWTH TO DATE CULTURE WILL BE HELD FOR 5 DAYS BEFORE ISSUING A FINAL NEGATIVE REPORT   Report Status PENDING   Incomplete   MRSA PCR SCREENING     Status: Normal   Collection Time   09/02/11  6:45 PM      Component Value Range Status Comment   MRSA by PCR NEGATIVE  NEGATIVE  Final     Studies/Results: Ct Head Wo Contrast  08/18/2011  *RADIOLOGY REPORT*  Clinical Data: Overdose with altered mental status, slurred speech and unsteady gait.  CT HEAD WITHOUT CONTRAST  Technique:  Contiguous axial images were obtained from the base of the skull through the vertex without contrast.  Comparison: None.  Findings: No evidence of acute infarct, acute hemorrhage, mass lesion, mass effect or hydrocephalus.  Visualized portions of the paranasal sinuses show scattered mucosal thickening.  No air fluid levels.  Mastoid air cells are clear.  No fracture.  IMPRESSION:  No acute intracranial abnormality.  Original Report Authenticated By: Reyes Ivan, M.D.   Dg Chest Port 1 View  09/01/2011  *RADIOLOGY REPORT*  Clinical Data: Weakness.  Altered mental status.  PORTABLE CHEST - 1 VIEW  Comparison: None.  Findings: The cardiomediastinal silhouette is within normal limits. Lungs are under aerated and clear.  No pneumothorax or pleural effusion.  IMPRESSION: No active cardiopulmonary disease.  Original Report Authenticated By: Donavan Burnet, M.D.   Dg Knee Complete 4 Views Left  08/18/2011  *RADIOLOGY REPORT*  Clinical Data: Fall with anterior knee pain and laceration.  LEFT KNEE - COMPLETE 4+ VIEW  Comparison: None.  Findings: Osteopenia.  No acute osseous abnormality.  No joint effusion.  Patellofemoral osteophytosis.  IMPRESSION: No joint effusion or fracture.  Osteopenia.  Original Report Authenticated By: Reyes Ivan, M.D.   Medications: Scheduled Meds:    . benztropine  0.5 mg Oral BID  . divalproex  250 mg Oral QHS  . ferrous fumarate  1 tablet Oral TID    . folic acid  1 mg Oral Daily  . ibuprofen  800 mg Oral TID  . lisinopril  40 mg Oral Daily  . pantoprazole  40 mg Oral Q1200  . risperiDONE  3 mg Oral QHS  . rOPINIRole  1 mg Oral QHS  . thiamine  100 mg Oral Daily  . DISCONTD: lisinopril  20 mg Oral Daily   Continuous Infusions:  PRN Meds:.albuterol, labetalol, loperamide, oxyCODONE, zolpidem  Assessment/Plan: Drug overdose (09/01/2011)/Suicide Attempt To inpatient Psych facility,  Major depressive disorder, recurrent episode (08/19/2011) Per Psych recommendations.  Encephalopathy (09/01/2011) Resolved.  Abdominal pain (09/05/2011) Exam benign but liver studies are elevated most likely secondary to her Tylenol overdose. Will continue to monitor , urinalysis negative.   Transaminitis As mentioned above multifactorial secondary to Tylenol and patient stated she also had chronic alcohol use in the past. Liver function is improving.  [facility]     LOS: 6 days   Ascencion Stegner JARRETT 09/07/2011, 9:48 PM

## 2011-09-08 LAB — CULTURE, BLOOD (ROUTINE X 2)
Culture  Setup Time: 201302080840
Culture: NO GROWTH

## 2011-09-08 LAB — AMMONIA: Ammonia: 33 umol/L (ref 11–60)

## 2011-09-08 MED ORDER — DOXYCYCLINE HYCLATE 100 MG PO TABS
100.0000 mg | ORAL_TABLET | Freq: Two times a day (BID) | ORAL | Status: DC
  Administered 2011-09-08 – 2011-09-10 (×6): 100 mg via ORAL
  Filled 2011-09-08 (×8): qty 1

## 2011-09-08 NOTE — Progress Notes (Signed)
Message from Pt's brother asking if he can bring Pt her belongings.  Spoke with Junious Dresser at [facility] and confirmed that Pt remains on the waiting list.  Spoke with Pt.  Pt's affect was brighter and she expressed that she feels much better due to adequate amounts of sleep.  Pt reports has learned that her son is with her mom and that this knowledge has helped her to rest better.  Called Pt's brother with Pt present and asked brother to bring Pt's belongings.  Brother will bring belongings today.  Pt gave CSW permission to discuss with brother her post-acute plans.  Explained to brother that Pt is on the waiting list at [facility]) and that it's likely that she will remain on that list for several more days.  CSW to continue to follow.

## 2011-09-08 NOTE — Progress Notes (Signed)
Follow up with pt.  Pt's was sitting up in bed with bright affect.   Pt was relieved that son is residing with pt's mother she has been able to speak with mother, son and sister in laws who provide support from out of state.  Pt spoke with chaplain about limiting interaction with brother, Gerlene Burdock, and creating a safe space for her to care for her mother and son.  She spoke about this as both freeing and sad, as they have had a close connection.  Does not want brother to live with them, however, they now rely on him for transportation.  Reported that brother brought her clothes today and it "went ok."  She was adamant about not letting him be "negative."  Pt desires to move back to [place] when finished with treatment where she has more resources and support through friends and church.   Chaplain brought resources for pt, provided emotional and spiritual support, grief support around brother.  Will continue to follow during admission.  Please page as needs arise.      09/08/11 1600  Clinical Encounter Type  Visited With Patient  Visit Type Follow-up;Psychological support;Spiritual support;Social support  Referral From Physician  Consult/Referral To Chaplain;Nurse  Spiritual Encounters  Spiritual Needs Emotional;Grief support;Prayer  Stress Factors  Patient Stress Factors Financial concerns;Family relationships;Lack of caregivers

## 2011-09-08 NOTE — Progress Notes (Signed)
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, it has grown over the last 4 days  Objective: Vital signs in last 24 hours: Filed Vitals:   09/07/11 0522 09/07/11 1300 09/07/11 2107 09/08/11 0520  BP: 143/96 150/82 148/77 129/83  Pulse: 63 67 67 70  Temp: 97.8 F (36.6 C) 98.3 F (36.8 C) 98.6 F (37 C) 98.4 F (36.9 C)  TempSrc: Oral Oral Oral Oral  Resp: 20 18 16 18   Height:      Weight: 74.3 kg (163 lb 12.8 oz)   73.4 kg (161 lb 13.1 oz)  SpO2: 97% 98% 97% 97%    Intake/Output Summary (Last 24 hours) at 09/08/11 1208 Last data filed at 09/08/11 0830  Gross per 24 hour  Intake   2160 ml  Output      0 ml  Net   2160 ml    Weight change: -0.9 kg (-1 lb 15.8 oz)  Gen:NAD  CV:RRR  LUNGS:CTA bilaterally  ABD: Soft mildly tender, positive BS  Ext:no edema   Lab Results: Results for orders placed during the hospital encounter of 09/01/11 (from the past 24 hour(s))  CK TOTAL AND CKMB     Status: Abnormal   Collection Time   09/08/11  9:57 AM      Component Value Range   Total CK 384 (*) 7 - 177 (U/L)   CK, MB 2.7  0.3 - 4.0 (ng/mL)   Relative Index 0.7  0.0 - 2.5   AMMONIA     Status: Normal   Collection Time   09/08/11  9:57 AM      Component Value Range   Ammonia 33  11 - 60 (umol/L)     Micro: Recent Results (from the past 240 hour(s))  URINE CULTURE     Status: Normal   Collection Time   09/01/11  2:09 PM      Component Value Range Status Comment   Specimen Description URINE, CATHETERIZED   Final    Special Requests NONE   Final    Culture  Setup Time [id number]   Final    Colony Count NO GROWTH   Final    Culture NO GROWTH   Final    Report Status 09/02/2011 FINAL   Final   CULTURE, BLOOD (ROUTINE X 2)     Status: Normal   Collection Time   09/02/11  4:25 AM      Component Value Range Status Comment   Specimen Description BLOOD RIGHT FOREARM   Final    Special Requests BOTTLES DRAWN AEROBIC AND  ANAEROBIC [facility]   Final    Culture  Setup Time [id number]   Final    Culture NO GROWTH 5 DAYS   Final    Report Status 09/08/2011 FINAL   Final   CULTURE, BLOOD (ROUTINE X 2)     Status: Normal   Collection Time   09/02/11  4:25 AM      Component Value Range Status Comment   Specimen Description BLOOD RIGHT HAND   Final    Special Requests BOTTLES DRAWN AEROBIC ONLY 5CC   Final    Culture  Setup Time [id number]   Final    Culture NO GROWTH 5 DAYS   Final    Report Status 09/08/2011 FINAL   Final   MRSA PCR SCREENING     Status: Normal   Collection Time   09/02/11  6:45 PM  Component Value Range Status Comment   MRSA by PCR NEGATIVE  NEGATIVE  Final     Studies/Results: No results found.  Medications:  Scheduled Meds:   . benztropine  0.5 mg Oral BID  . divalproex  250 mg Oral QHS  . doxycycline  100 mg Oral Q12H  . ferrous fumarate  1 tablet Oral TID  . folic acid  1 mg Oral Daily  . lisinopril  40 mg Oral Daily  . pantoprazole  40 mg Oral Q1200  . risperiDONE  3 mg Oral QHS  . rOPINIRole  1 mg Oral QHS  . thiamine  100 mg Oral Daily  . DISCONTD: ibuprofen  800 mg Oral TID  . DISCONTD: lisinopril  20 mg Oral Daily   Continuous Infusions:  PRN Meds:.albuterol, labetalol, loperamide, oxyCODONE, zolpidem   Assessment:   Drug overdose (09/01/2011)/Suicide Attempt  To inpatient Psych facility, when bed available   Subcutaneous cyst in the right breast Start doxycycline, it does not resolve off a seven-day course of antibiotics she may need an outpatient Gen. surgery referral  Major depressive disorder, recurrent episode (08/19/2011)  Per Psych recommendations.   Encephalopathy (09/01/2011)  Resolved.   Abdominal pain (09/05/2011)  Exam benign but liver studies are elevated most likely secondary to her Tylenol overdose. Will continue to monitor , urinalysis negative.   Transaminitis could be secondary to mildly elevated total CK As mentioned above multifactorial  secondary to Tylenol and patient stated she also had chronic alcohol use in the past. Liver function is improving.   [facility]       LOS: 7 days   [facility] 09/08/2011, 12:08 PM

## 2011-09-08 NOTE — Progress Notes (Signed)
Patient made RN aware of a dark nodule on her right breast.  Patient reported that she had MRSA in her right breast about 3 years ago, but has not had any other problems since then.  MRSA PCR swab was negative on 09/02/11.

## 2011-09-09 NOTE — Progress Notes (Signed)
Follow up with pt -- met for Therapeutic group on 5 [place] with chaplain and Doctoral counseling intern, Juanetta Beets  Pt was only one able to be present for group, so group took place in pt's room.  Pt was engaged and talkative.   Group outline centered around courage.  Pt expressed that she has a fear of the unknown and specifically what is going to happen for her in state hospital and in next steps in her treatment.   Group named ideas of courage and places of fear and reflected on how these were present in their lives.  Pt sees mother as courageous and is motivated to pursue next steps in her treatment to be able to be present for her son.    Pt reflected on upbringing and family system throughout group.  Pt expressed that there was not room in her relationship with her family to express doubt and fear and she was always encouraged to "put your big girl panties on."  Explored briefly how this may be present in her feelings of being overwhelmed with care of son desire not to ask for help with son.  Pt also expressed that though she knows her mother cared for her, she may not have received the attention and love that she desired, being the youngest and only girl.  She related being "toted around to watch her brothers do things."  Pt became tearful around these two areas and group reflection on pain she may still carry.    Chaplain will continue to follow and provide support during hospitalization.    09/09/11 1500  Clinical Encounter Type  Visited With Patient;Health care provider  Visit Type Follow-up;Psychological support;Spiritual support;Social support;Behavioral Health  Recommendations Follow up  Spiritual Encounters  Spiritual Needs Emotional;Grief support;Prayer  Stress Factors  Patient Stress Factors Family relationships;Financial concerns;Major life changes

## 2011-09-09 NOTE — Progress Notes (Signed)
IVC papers served by the [employer] and placed on the patient's chart

## 2011-09-09 NOTE — Progress Notes (Signed)
Subjective: No complaints today  Objective: Vital signs in last 24 hours: Filed Vitals:   09/08/11 1325 09/08/11 2241 09/09/11 0500 09/09/11 0633  BP: 138/92 140/84  125/78  Pulse: 74 68  69  Temp:  98.1 F (36.7 C)  98 F (36.7 C)  TempSrc: Oral Oral  Oral  Resp: 16 18  18   Height:      Weight:   75.751 kg (167 lb)   SpO2: 98% 99%  96%    Intake/Output Summary (Last 24 hours) at 09/09/11 1353 Last data filed at 09/09/11 1200  Gross per 24 hour  Intake   1440 ml  Output      0 ml  Net   1440 ml    Weight change: 2.351 kg (5 lb 2.9 oz)  Gen:NAD  CV:RRR  LUNGS:CTA bilaterally  ABD: Soft mildly tender, positive BS  Ext:no edema   Lab Results: No results found for this or any previous visit (from the past 24 hour(s)).   Micro: Recent Results (from the past 240 hour(s))  URINE CULTURE     Status: Normal   Collection Time   09/01/11  2:09 PM      Component Value Range Status Comment   Specimen Description URINE, CATHETERIZED   Final    Special Requests NONE   Final    Culture  Setup Time [id number]   Final    Colony Count NO GROWTH   Final    Culture NO GROWTH   Final    Report Status 09/02/2011 FINAL   Final   CULTURE, BLOOD (ROUTINE X 2)     Status: Normal   Collection Time   09/02/11  4:25 AM      Component Value Range Status Comment   Specimen Description BLOOD RIGHT FOREARM   Final    Special Requests BOTTLES DRAWN AEROBIC AND ANAEROBIC [facility]   Final    Culture  Setup Time [id number]   Final    Culture NO GROWTH 5 DAYS   Final    Report Status 09/08/2011 FINAL   Final   CULTURE, BLOOD (ROUTINE X 2)     Status: Normal   Collection Time   09/02/11  4:25 AM      Component Value Range Status Comment   Specimen Description BLOOD RIGHT HAND   Final    Special Requests BOTTLES DRAWN AEROBIC ONLY 5CC   Final    Culture  Setup Time [id number]   Final    Culture NO GROWTH 5 DAYS   Final    Report Status 09/08/2011 FINAL   Final   MRSA PCR SCREENING     Status:  Normal   Collection Time   09/02/11  6:45 PM      Component Value Range Status Comment   MRSA by PCR NEGATIVE  NEGATIVE  Final     Studies/Results: No results found.  Medications:  Scheduled Meds:   . benztropine  0.5 mg Oral BID  . divalproex  250 mg Oral QHS  . doxycycline  100 mg Oral Q12H  . ferrous fumarate  1 tablet Oral TID  . folic acid  1 mg Oral Daily  . lisinopril  40 mg Oral Daily  . pantoprazole  40 mg Oral Q1200  . risperiDONE  3 mg Oral QHS  . rOPINIRole  1 mg Oral QHS  . thiamine  100 mg Oral Daily   Continuous Infusions:  PRN Meds:.albuterol, labetalol, loperamide, oxyCODONE, zolpidem   Assessment:   Drug overdose (  09/01/2011)/Suicide Attempt  To inpatient Psych facility, when bed available IVC renewed   Subcutaneous cyst in the right breast  Start doxycycline, it does not resolve off a seven-day course of antibiotics she may need an outpatient Gen. surgery referral   Major depressive disorder, recurrent episode (08/19/2011)  Per Psych recommendations.   Encephalopathy (09/01/2011)  Resolved.   Abdominal pain (09/05/2011)  Exam benign but liver studies are elevated most likely secondary to her Tylenol overdose. Will continue to monitor , urinalysis negative.   Transaminitis could be secondary to mildly elevated total CK  As mentioned above multifactorial secondary to Tylenol and patient stated she also had chronic alcohol use in the past.   Liver function is improving.  [facility].        LOS: 8 days   [facility] 09/09/2011, 1:53 PM

## 2011-09-10 LAB — COMPREHENSIVE METABOLIC PANEL
Albumin: 3.3 g/dL — ABNORMAL LOW (ref 3.5–5.2)
Alkaline Phosphatase: 115 U/L (ref 39–117)
BUN: 9 mg/dL (ref 6–23)
Chloride: 99 mEq/L (ref 96–112)
Creatinine, Ser: 0.55 mg/dL (ref 0.50–1.10)
GFR calc Af Amer: >90 mL/min (ref >90)
GFR calc non Af Amer: >90 mL/min (ref >90)
Glucose, Bld: 110 mg/dL — ABNORMAL HIGH (ref 70–99)
Potassium: 4.3 mEq/L (ref 3.5–5.1)
Total Bilirubin: 0.2 mg/dL — ABNORMAL LOW (ref 0.3–1.2)

## 2011-09-10 LAB — URINALYSIS, ROUTINE W REFLEX MICROSCOPIC
Ketones, ur: NEGATIVE mg/dL
Leukocytes, UA: NEGATIVE
Nitrite: NEGATIVE
Protein, ur: NEGATIVE mg/dL
Urobilinogen, UA: 0.2 mg/dL (ref 0.0–1.0)

## 2011-09-10 LAB — VALPROIC ACID LEVEL: Valproic Acid Lvl: 13.4 ug/mL — ABNORMAL LOW (ref 50.0–100.0)

## 2011-09-10 MED ORDER — PROMETHAZINE HCL 25 MG/ML IJ SOLN
12.5000 mg | Freq: Four times a day (QID) | INTRAMUSCULAR | Status: DC | PRN
  Administered 2011-09-10 – 2011-09-12 (×10): 12.5 mg via INTRAVENOUS
  Filled 2011-09-10 (×10): qty 1

## 2011-09-10 NOTE — Progress Notes (Signed)
Subjective: Complaining of nausea, no abdominal pain  Objective: Vital signs in last 24 hours: Filed Vitals:   09/09/11 1422 09/09/11 2155 09/10/11 0600 09/10/11 1331  BP: 130/82 122/82 114/73 104/69  Pulse: 76 73 72 88  Temp: 98.1 F (36.7 C) 97.7 F (36.5 C) 97.9 F (36.6 C) 98.4 F (36.9 C)  TempSrc: Oral Oral Oral Oral  Resp: 18 18 18 20   Height:      Weight:   72 kg (158 lb 11.7 oz)   SpO2: 99% 99% 95% 96%    Intake/Output Summary (Last 24 hours) at 09/10/11 1420 Last data filed at 09/10/11 1300  Gross per 24 hour  Intake   2620 ml  Output   1225 ml  Net   1395 ml    Weight change: -3.751 kg (-8 lb 4.3 oz)  Gen:NAD  CV:RRR  LUNGS:CTA bilaterally  ABD: Soft mildly tender, positive BS  Ext:no edema      Lab Results: Results for orders placed during the hospital encounter of 09/01/11 (from the past 24 hour(s))  URINALYSIS, ROUTINE W REFLEX MICROSCOPIC     Status: Normal   Collection Time   09/10/11  8:39 AM      Component Value Range   Color, Urine YELLOW  YELLOW    APPearance CLEAR  CLEAR    Specific Gravity, Urine 1.011  1.005 - 1.030    pH 6.5  5.0 - 8.0    Glucose, UA NEGATIVE  NEGATIVE (mg/dL)   Hgb urine dipstick NEGATIVE  NEGATIVE    Bilirubin Urine NEGATIVE  NEGATIVE    Ketones, ur NEGATIVE  NEGATIVE (mg/dL)   Protein, ur NEGATIVE  NEGATIVE (mg/dL)   Urobilinogen, UA 0.2  0.0 - 1.0 (mg/dL)   Nitrite NEGATIVE  NEGATIVE    Leukocytes, UA NEGATIVE  NEGATIVE   MAGNESIUM     Status: Normal   Collection Time   09/10/11  9:00 AM      Component Value Range   Magnesium 2.1  1.5 - 2.5 (mg/dL)  COMPREHENSIVE METABOLIC PANEL     Status: Abnormal   Collection Time   09/10/11  9:00 AM      Component Value Range   Sodium 134 (*) 135 - 145 (mEq/L)   Potassium 4.3  3.5 - 5.1 (mEq/L)   Chloride 99  96 - 112 (mEq/L)   CO2 25  19 - 32 (mEq/L)   Glucose, Bld 110 (*) 70 - 99 (mg/dL)   BUN 9  6 - 23 (mg/dL)   Creatinine, Ser 1.61  0.50 - 1.10 (mg/dL)   Calcium 9.5  8.4 - 09.6 (mg/dL)   Total Protein 6.8  6.0 - 8.3 (g/dL)   Albumin 3.3 (*) 3.5 - 5.2 (g/dL)   AST 23  0 - 37 (U/L)   ALT 34  0 - 35 (U/L)   Alkaline Phosphatase 115  39 - 117 (U/L)   Total Bilirubin 0.2 (*) 0.3 - 1.2 (mg/dL)   GFR calc non Af Amer >90  >90 (mL/min)   GFR calc Af Amer >90  >90 (mL/min)  VALPROIC ACID LEVEL     Status: Abnormal   Collection Time   09/10/11  9:00 AM      Component Value Range   Valproic Acid Lvl 13.4 (*) 50.0 - 100.0 (ug/mL)     Micro: Recent Results (from the past 240 hour(s))  URINE CULTURE     Status: Normal   Collection Time   09/01/11  2:09 PM  Component Value Range Status Comment   Specimen Description URINE, CATHETERIZED   Final    Special Requests NONE   Final    Culture  Setup Time [id number]   Final    Colony Count NO GROWTH   Final    Culture NO GROWTH   Final    Report Status 09/02/2011 FINAL   Final   CULTURE, BLOOD (ROUTINE X 2)     Status: Normal   Collection Time   09/02/11  4:25 AM      Component Value Range Status Comment   Specimen Description BLOOD RIGHT FOREARM   Final    Special Requests BOTTLES DRAWN AEROBIC AND ANAEROBIC [facility]   Final    Culture  Setup Time [id number]   Final    Culture NO GROWTH 5 DAYS   Final    Report Status 09/08/2011 FINAL   Final   CULTURE, BLOOD (ROUTINE X 2)     Status: Normal   Collection Time   09/02/11  4:25 AM      Component Value Range Status Comment   Specimen Description BLOOD RIGHT HAND   Final    Special Requests BOTTLES DRAWN AEROBIC ONLY 5CC   Final    Culture  Setup Time [id number]   Final    Culture NO GROWTH 5 DAYS   Final    Report Status 09/08/2011 FINAL   Final   MRSA PCR SCREENING     Status: Normal   Collection Time   09/02/11  6:45 PM      Component Value Range Status Comment   MRSA by PCR NEGATIVE  NEGATIVE  Final     Studies/Results: No results found.  Medications:  Scheduled Meds:   . benztropine  0.5 mg Oral BID  . divalproex  250 mg Oral QHS   . doxycycline  100 mg Oral Q12H  . ferrous fumarate  1 tablet Oral TID  . folic acid  1 mg Oral Daily  . lisinopril  40 mg Oral Daily  . pantoprazole  40 mg Oral Q1200  . risperiDONE  3 mg Oral QHS  . rOPINIRole  1 mg Oral QHS  . thiamine  100 mg Oral Daily   Continuous Infusions:  PRN Meds:.albuterol, labetalol, loperamide, oxyCODONE, promethazine, zolpidem   Assessment:   Drug overdose (09/01/2011)/Suicide Attempt  To inpatient Psych facility, when bed available  IVC renewed   Subcutaneous cyst in the right breast  Start doxycycline, it does not resolve off a seven-day course of antibiotics she may need an outpatient Gen. surgery referral   Major depressive disorder, recurrent episode (08/19/2011)  Per Psych recommendations.   Encephalopathy (09/01/2011)  Resolved.   Abdominal pain (09/05/2011)  Exam benign but liver studies are elevated most likely secondary to her Tylenol overdose. Will continue to monitor , urinalysis negative. Check liver function tests again   Transaminitis could be secondary to mildly elevated total CK  As mentioned above multifactorial secondary to Tylenol and patient stated she also had chronic alcohol use in the past.   Liver function is improving.    [facility]         LOS: 9 days   Craig Wisnewski 09/10/2011, 2:20 PM

## 2011-09-11 MED ORDER — DOXYCYCLINE HYCLATE 100 MG IV SOLR
100.0000 mg | Freq: Two times a day (BID) | INTRAVENOUS | Status: DC
  Administered 2011-09-11 – 2011-09-12 (×3): 100 mg via INTRAVENOUS
  Filled 2011-09-11 (×4): qty 100

## 2011-09-11 MED ORDER — PANTOPRAZOLE SODIUM 40 MG PO TBEC
40.0000 mg | DELAYED_RELEASE_TABLET | Freq: Two times a day (BID) | ORAL | Status: DC
  Administered 2011-09-11 – 2011-09-12 (×4): 40 mg via ORAL
  Filled 2011-09-11 (×5): qty 1

## 2011-09-11 NOTE — Progress Notes (Signed)
Subjective: Complaining of nausea this morning, also had some diarrhea  Objective: Vital signs in last 24 hours: Filed Vitals:   09/10/11 0600 09/10/11 1331 09/10/11 2136 09/11/11 0600  BP: 114/73 104/69 121/76 109/68  Pulse: 72 88 71 71  Temp: 97.9 F (36.6 C) 98.4 F (36.9 C) 98.2 F (36.8 C) 97.7 F (36.5 C)  TempSrc: Oral Oral Oral Oral  Resp: 18 20 20 20   Height:      Weight: 72 kg (158 lb 11.7 oz)   76.2 kg (167 lb 15.9 oz)  SpO2: 95% 96% 99% 98%    Intake/Output Summary (Last 24 hours) at 09/11/11 1041 Last data filed at 09/11/11 0746  Gross per 24 hour  Intake   2020 ml  Output   2800 ml  Net   -780 ml    Weight change: 4.2 kg (9 lb 4.2 oz)  Gen:NAD  CV:RRR  LUNGS:CTA bilaterally  ABD: Soft mildly tender, positive BS  Ext:no edema    Lab Results: No results found for this or any previous visit (from the past 24 hour(s)).   Micro: Recent Results (from the past 240 hour(s))  URINE CULTURE     Status: Normal   Collection Time   09/01/11  2:09 PM      Component Value Range Status Comment   Specimen Description URINE, CATHETERIZED   Final    Special Requests NONE   Final    Culture  Setup Time [id number]   Final    Colony Count NO GROWTH   Final    Culture NO GROWTH   Final    Report Status 09/02/2011 FINAL   Final   CULTURE, BLOOD (ROUTINE X 2)     Status: Normal   Collection Time   09/02/11  4:25 AM      Component Value Range Status Comment   Specimen Description BLOOD RIGHT FOREARM   Final    Special Requests BOTTLES DRAWN AEROBIC AND ANAEROBIC [facility]   Final    Culture  Setup Time [id number]   Final    Culture NO GROWTH 5 DAYS   Final    Report Status 09/08/2011 FINAL   Final   CULTURE, BLOOD (ROUTINE X 2)     Status: Normal   Collection Time   09/02/11  4:25 AM      Component Value Range Status Comment   Specimen Description BLOOD RIGHT HAND   Final    Special Requests BOTTLES DRAWN AEROBIC ONLY 5CC   Final    Culture  Setup Time [id number]    Final    Culture NO GROWTH 5 DAYS   Final    Report Status 09/08/2011 FINAL   Final   MRSA PCR SCREENING     Status: Normal   Collection Time   09/02/11  6:45 PM      Component Value Range Status Comment   MRSA by PCR NEGATIVE  NEGATIVE  Final     Studies/Results: No results found.  Medications: { Scheduled Meds:   . benztropine  0.5 mg Oral BID  . divalproex  250 mg Oral QHS  . doxycycline (VIBRAMYCIN) IV  100 mg Intravenous Q12H  . ferrous fumarate  1 tablet Oral TID  . folic acid  1 mg Oral Daily  . lisinopril  40 mg Oral Daily  . pantoprazole  40 mg Oral BID AC  . risperiDONE  3 mg Oral QHS  . rOPINIRole  1 mg Oral QHS  . thiamine  100 mg  Oral Daily  . DISCONTD: doxycycline  100 mg Oral Q12H  . DISCONTD: pantoprazole  40 mg Oral Q1200   Continuous Infusions:  PRN Meds:.albuterol, labetalol, oxyCODONE, promethazine, zolpidem, DISCONTD: loperamide   Assessment:   Drug overdose (09/01/2011)/Suicide Attempt  To inpatient Psych facility, when bed available  IVC renewed   Subcutaneous cyst in the right breast  Start doxycycline, it does not resolve off a seven-day course of antibiotics she may need an outpatient Gen. surgery referral   Nausea likely secondary to doxycycline but change to IV if no benefit in the next 2 days and then DC doxycycline Have  checked liver enzymes as well as lipase, and a Depakote level, all are within normal limits    Major depressive disorder, recurrent episode (08/19/2011)  Per Psych recommendations.   Encephalopathy (09/01/2011)  Resolved.   Abdominal pain (09/05/2011)  Exam benign but liver studies are elevated most likely secondary to her Tylenol overdose. Will continue to monitor , urinalysis negative. Check liver function tests again     Transaminitis could be secondary to mildly elevated total CK  As mentioned above multifactorial secondary to Tylenol and patient stated she also had chronic alcohol use in the past.  Liver function  is improving.      [facility].        LOS: 10 days   [facility] 09/11/2011, 10:41 AM

## 2011-09-12 MED ORDER — DIVALPROEX SODIUM 250 MG PO DR TAB: 250.0000 mg | DELAYED_RELEASE_TABLET | Freq: Every day | ORAL | Status: DC

## 2011-09-12 MED ORDER — RISPERIDONE 1 MG PO TABS: 1.0000 mg | ORAL_TABLET | Freq: Every day | ORAL | Status: DC

## 2011-09-12 MED ORDER — LISINOPRIL 20 MG PO TABS: 40.0000 mg | ORAL_TABLET | Freq: Every day | ORAL | Status: DC

## 2011-09-12 MED ORDER — DOXYCYCLINE HYCLATE 100 MG PO TABS: 100.0000 mg | ORAL_TABLET | Freq: Two times a day (BID) | ORAL | Status: AC

## 2011-09-12 MED ORDER — ENSURE CLINICAL ST REVIGOR PO LIQD
237.0000 mL | Freq: Two times a day (BID) | ORAL | Status: DC
  Administered 2011-09-12: 237 mL via ORAL

## 2011-09-12 MED ORDER — GABAPENTIN 100 MG PO CAPS: 200.0000 mg | ORAL_CAPSULE | Freq: Three times a day (TID) | ORAL | Status: DC

## 2011-09-12 MED ORDER — FOLIC ACID 1 MG PO TABS: 1.0000 mg | ORAL_TABLET | Freq: Every day | ORAL | Status: DC

## 2011-09-12 MED ORDER — BENZTROPINE MESYLATE 0.5 MG PO TABS: 0.5000 mg | ORAL_TABLET | Freq: Two times a day (BID) | ORAL | Status: DC

## 2011-09-12 NOTE — Progress Notes (Signed)
Per Junious Dresser at [facility], Pt is close to the top of the waiting list.  Notified Pt, RN and MD.  Providence Crosby, LCSWA Clinical Social Work [phone]

## 2011-09-12 NOTE — Progress Notes (Signed)
Subjective: No complaints  Nausea is improved   Objective: Vital signs in last 24 hours: Filed Vitals:   09/11/11 2149 09/11/11 2356 09/12/11 0500 09/12/11 0518  BP: 121/73   113/65  Pulse: 80   71  Temp: 98 F (36.7 C)   97.9 F (36.6 C)  TempSrc: Oral   Oral  Resp: 18   20  Height:      Weight:   77.9 kg (171 lb 11.8 oz)   SpO2: 99% 93%  98%    Intake/Output Summary (Last 24 hours) at 09/12/11 1411 Last data filed at 09/12/11 1018  Gross per 24 hour  Intake   2530 ml  Output   3300 ml  Net   -770 ml    Weight change: 1.7 kg (3 lb 12 oz) Gen:NAD  CV:RRR  LUNGS:CTA bilaterally  ABD: Soft mildly tender, positive BS  Ext:no edema    Lab Results: Results for orders placed during the hospital encounter of 09/01/11 (from the past 24 hour(s))  CLOSTRIDIUM DIFFICILE BY PCR     Status: Normal   Collection Time   09/11/11  4:33 PM      Component Value Range   C difficile by pcr NEGATIVE  NEGATIVE      Micro: Recent Results (from the past 240 hour(s))  MRSA PCR SCREENING     Status: Normal   Collection Time   09/02/11  6:45 PM      Component Value Range Status Comment   MRSA by PCR NEGATIVE  NEGATIVE  Final   CLOSTRIDIUM DIFFICILE BY PCR     Status: Normal   Collection Time   09/11/11  4:33 PM      Component Value Range Status Comment   C difficile by pcr NEGATIVE  NEGATIVE  Final     Studies/Results: No results found.  Medications:  Scheduled Meds:   . benztropine  0.5 mg Oral BID  . divalproex  250 mg Oral QHS  . doxycycline (VIBRAMYCIN) IV  100 mg Intravenous Q12H  . feeding supplement  237 mL Oral BID WC  . ferrous fumarate  1 tablet Oral TID  . folic acid  1 mg Oral Daily  . lisinopril  40 mg Oral Daily  . pantoprazole  40 mg Oral BID AC  . risperiDONE  3 mg Oral QHS  . rOPINIRole  1 mg Oral QHS  . thiamine  100 mg Oral Daily   Continuous Infusions:  PRN Meds:.albuterol, labetalol, oxyCODONE, promethazine, zolpidem   Assessment:  Drug  overdose (09/01/2011)/Suicide Attempt  To inpatient Psych facility, when bed available  IVC renewed   Subcutaneous cyst in the right breast  Start doxycycline, it does not resolve off a seven-day course of antibiotics she may need an outpatient Gen. surgery referral    Nausea likely secondary to doxycycline but change to IV if no benefit in the next 2 days and then DC doxycycline  Have checked liver enzymes as well as lipase, and a Depakote level, all are within normal limits   Major depressive disorder, recurrent episode (08/19/2011)  Per Psych recommendations.   Encephalopathy (09/01/2011)  Resolved.   Abdominal pain (09/05/2011)  Exam benign but liver studies are elevated most likely secondary to her Tylenol overdose. Will continue to monitor , urinalysis negative. Check liver function tests again  Transaminitis could be secondary to mildly elevated total CK    As mentioned above multifactorial secondary to Tylenol and patient stated she also had chronic alcohol use in the  past.  Liver function is improving.  [facility]        LOS: 11 days   Sharon Nicholson 09/12/2011, 2:11 PM

## 2011-09-12 NOTE — Progress Notes (Signed)
Per Junious Dresser at [facility], bed available.  Junious Dresser requesting the following documents: IVC  Typed d/c summary 3 days worth of MARs 3 days worth of vitals  Notified Pt, RN and MD.  Faxed documents to [facility].  Providence Crosby, LCSWA Clinical Social Work [phone]

## 2011-09-12 NOTE — Progress Notes (Addendum)
[facility] is being DC to [facility]  IV DC.  Meds given prior to DC.  Patient is to transport to facility via sheriff.  3 bags of personal belongings returned and sent with the patient.  Patient reported that she had all belonging that belonged to her prior to DC.
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
## 2011-09-12 NOTE — Progress Notes (Signed)
Per State, bed ready.  Notified Care Coordinator.  Arrange for transportation with the [employer].  Pt to be d/c'd.  Providence Crosby, LCSWA Clinical Social Work [phone]

## 2011-09-14 NOTE — ED Notes (Signed)
Called ICU/SD unit to check if pt's belonging is still there, Marylu Lund sec/tech states that pt belonging is there.  Family's number [phone] given to [facility].  She reports that she will call to notify family that they have pt's belonging.

## 2011-09-21 ENCOUNTER — Ambulatory Visit: Payer: Medicare Other | Admitting: Physical Medicine & Rehabilitation

## 2011-09-23 ENCOUNTER — Ambulatory Visit: Payer: Medicare Other | Admitting: Physical Medicine & Rehabilitation

## 2012-01-15 ENCOUNTER — Encounter (HOSPITAL_COMMUNITY): Payer: Self-pay | Admitting: *Deleted

## 2012-01-15 ENCOUNTER — Emergency Department (HOSPITAL_COMMUNITY)
Admission: EM | Admit: 2012-01-15 | Discharge: 2012-01-15 | Disposition: A | Discharge status: Home / Self Care | Payer: Medicare Other | Attending: Emergency Medicine | Admitting: Emergency Medicine

## 2012-01-15 DIAGNOSIS — F1020 Alcohol dependence, uncomplicated: Secondary | ICD-10-CM | POA: Insufficient documentation

## 2012-01-15 DIAGNOSIS — Z79899 Other long term (current) drug therapy: Secondary | ICD-10-CM | POA: Insufficient documentation

## 2012-01-15 DIAGNOSIS — M329 Systemic lupus erythematosus, unspecified: Secondary | ICD-10-CM | POA: Insufficient documentation

## 2012-01-15 DIAGNOSIS — J40 Bronchitis, not specified as acute or chronic: Secondary | ICD-10-CM | POA: Insufficient documentation

## 2012-01-15 DIAGNOSIS — I10 Essential (primary) hypertension: Secondary | ICD-10-CM | POA: Insufficient documentation

## 2012-01-15 DIAGNOSIS — F17200 Nicotine dependence, unspecified, uncomplicated: Secondary | ICD-10-CM | POA: Insufficient documentation

## 2012-01-15 DIAGNOSIS — Z765 Malingerer [conscious simulation]: Secondary | ICD-10-CM | POA: Insufficient documentation

## 2012-01-15 DIAGNOSIS — F1910 Other psychoactive substance abuse, uncomplicated: Secondary | ICD-10-CM | POA: Insufficient documentation

## 2012-01-15 DIAGNOSIS — G8929 Other chronic pain: Secondary | ICD-10-CM | POA: Insufficient documentation

## 2012-01-15 MED ORDER — ALBUTEROL SULFATE (2.5 MG/3ML) 0.083% IN NEBU: 2.5000 mg | INHALATION_SOLUTION | RESPIRATORY_TRACT | Status: DC | PRN

## 2012-01-15 MED ORDER — ALBUTEROL SULFATE (5 MG/ML) 0.5% IN NEBU
10.0000 mg | INHALATION_SOLUTION | Freq: Once | RESPIRATORY_TRACT | Status: AC
  Administered 2012-01-15: 10 mg via RESPIRATORY_TRACT
  Filled 2012-01-15: qty 2

## 2012-01-15 MED ORDER — IPRATROPIUM BROMIDE 0.02 % IN SOLN
RESPIRATORY_TRACT | Status: AC
  Filled 2012-01-15: qty 2.5

## 2012-01-15 MED ORDER — PREDNISONE 20 MG PO TABS
60.0000 mg | ORAL_TABLET | Freq: Once | ORAL | Status: AC
  Administered 2012-01-15: 60 mg via ORAL
  Filled 2012-01-15: qty 3

## 2012-01-15 MED ORDER — IPRATROPIUM BROMIDE 0.02 % IN SOLN
0.5000 mg | Freq: Once | RESPIRATORY_TRACT | Status: AC
  Administered 2012-01-15: 0.5 mg via RESPIRATORY_TRACT

## 2012-01-15 MED ORDER — PREDNISONE 20 MG PO TABS: 20.0000 mg | ORAL_TABLET | Freq: Two times a day (BID) | ORAL | Status: AC

## 2012-01-15 MED ORDER — ALBUTEROL SULFATE (5 MG/ML) 0.5% IN NEBU
5.0000 mg | INHALATION_SOLUTION | Freq: Once | RESPIRATORY_TRACT | Status: AC
  Administered 2012-01-15: 5 mg via RESPIRATORY_TRACT

## 2012-01-15 MED ORDER — OXYCODONE-ACETAMINOPHEN 5-325 MG PO TABS
1.0000 | ORAL_TABLET | Freq: Once | ORAL | Status: AC
  Administered 2012-01-15: 1 via ORAL
  Filled 2012-01-15: qty 1

## 2012-01-15 MED ORDER — IPRATROPIUM BROMIDE 0.02 % IN SOLN
0.5000 mg | Freq: Once | RESPIRATORY_TRACT | Status: AC
  Administered 2012-01-15: 0.5 mg via RESPIRATORY_TRACT
  Filled 2012-01-15: qty 2.5

## 2012-01-15 MED ORDER — ALBUTEROL SULFATE (5 MG/ML) 0.5% IN NEBU
INHALATION_SOLUTION | RESPIRATORY_TRACT | Status: AC
  Filled 2012-01-15: qty 1

## 2012-01-15 NOTE — ED Notes (Signed)
Per EMS:  Pt was diagnosed with lupus 4 years ago with Lupus.  Pt is on steroids as needed and she hasn't been taking those steroids over the last 4 days and the pain has gotten worse and worse over that time.  Pt st's the pain is beginning to effect her breathing; EMS st's lung sounds are clear, pt is 97% on RA.  Conscious, alert and oriented x4, pt also complaining of a h/a, EMS did a stroke assessment and said it was negative.  No n/v/d, no fevers.  Pain originating in knees and radiating down into her calf.

## 2012-01-15 NOTE — Discharge Instructions (Signed)
Use the resource guide to find a Dr. to see for further care.    Bronchitis Bronchitis is a problem of the air tubes leading to your lungs. This problem makes it hard for air to get in and out of the lungs. You may cough a lot because your air tubes are narrow. Going without care can cause lasting (chronic) bronchitis. HOME CARE   Drink enough fluids to keep your pee (urine) clear or pale yellow.   Use a cool mist humidifier.   Quit smoking if you smoke. If you keep smoking, the bronchitis might not get better.   Only take medicine as told by your doctor.  GET HELP RIGHT AWAY IF:   Coughing keeps you awake.   You start to wheeze.   You become more sick or weak.   You have a hard time breathing or get short of breath.   You cough up blood.   Coughing lasts more than 2 weeks.   You have a fever.   Your baby is older than 3 months with a rectal temperature of 102 F (38.9 C) or higher.   Your baby is 27 months old or younger with a rectal temperature of 100.4 F (38 C) or higher.  MAKE SURE YOU:  Understand these instructions.   Will watch your condition.   Will get help right away if you are not doing well or get worse.  Document Released: 12/28/2007 Document Revised: 06/30/2011 Document Reviewed: 06/12/2009 [facility] Patient Information 2012 [place], [place].Chronic Back Pain When back pain lasts longer than 3 months, it is called chronic back pain.This pain can be frustrating, but the cause of the pain is rarely dangerous.People with chronic back pain often go through certain periods that are more intense (flare-ups). CAUSES Chronic back pain can be caused by wear and tear (degeneration) on different structures in your back. These structures may include bones, ligaments, or discs. This degeneration may result in more pressure being placed on the nerves that travel to your legs and feet. This can lead to pain traveling from the low back down the back of the legs. When  pain lasts longer than 3 months, it is not unusual for people to experience anxiety or depression. Anxiety and depression can also contribute to low back pain. TREATMENT  Establish a regular exercise plan. This is critical to improving your functional level.   Have a self-management plan for when you flare-up. Flare-ups rarely require a medical visit. Regular exercise will help reduce the intensity and frequency of your flare-ups.   Manage how you feel about your back pain and the rest of your life. Anxiety, depression, and feeling that you cannot alter your back pain have been shown to make back pain more intense and debilitating.   Medicines should never be your only treatment. They should be used along with other treatments to help you return to a more active lifestyle.   Procedures such as injections or surgery may be helpful but are rarely necessary. You may be able to get the same results with physical therapy or chiropractic care.  HOME CARE INSTRUCTIONS  Avoid bending, heavy lifting, prolonged sitting, and activities which make the problem worse.   Continue normal activity as much as possible.   Take brief periods of rest throughout the day to reduce your pain during flare-ups.   Follow your back exercise rehabilitation program. This can help reduce symptoms and prevent more pain.   Only take over-the-counter or prescription medicines as directed by  your caregiver. Muscle relaxants are sometimes prescribed. Narcotic pain medicine is discouraged for long-term pain, since addiction is a possible outcome.   If you smoke, quit.   Eat healthy foods and maintain a recommended body weight.  SEEK IMMEDIATE MEDICAL CARE IF:   You have weakness or numbness in one of your legs or feet.   You have trouble controlling your bladder or bowels.   You develop nausea, vomiting, abdominal pain, shortness of breath, or fainting.  Document Released: 08/18/2004 Document Revised: 06/30/2011  Document Reviewed: 06/25/2011 [facility] Patient Information 2012 [place], [place].   RESOURCE GUIDE  Chronic Pain Problems: Contact [facility]  [phone] Patients need to be referred by their primary care doctor.  Insufficient Money for Medicine: Contact United Way:  call "211" or [facility] [phone].  No Primary Care Doctor: - Call [facility] Connect  [phone] - can help you locate a primary care doctor that  accepts your insurance, provides certain services, etc. - Physician Referral Service- [phone]  Agencies that provide inexpensive medical care: - [facility]  [phone] - [facility]  [phone] - [facility]  [phone] - Women's Clinic  [phone] - Planned Parenthood  [phone] [facility]  [phone]  Medicaid-accepting [facility] Providers: - [facility]- [address]  [phone], Mon-Fri 9am-7pm, Sat 9am-1pm - [facility]- [address] [place], [address] [place]  [phone] - [facility]- [address] [place]  [phone] [facility]- [address]  [phone] - Renaye Rakers- [address] [place], [address], [phone]  Only accepts [place] patients after they have their name  applied to their card  Self Pay (no insurance) in [place]: - Sickle Cell Patients: Dr Willey Blade, [facility] Internal [facility]  [address] [place], [phone] - [facility]- [address] [place]  [phone]       [facility]- [address]       -     [facility]- see information above (Speak to [employer] if you do not have insurance)       -  [facility]- [address] [place], [phone]       -  [facility]- [address],  [phone]       -  [facility]- [address] [phone]       -  Dr Julio Sicks-   [address], [place], [phone]       -  [facility]gent [facility]- [address] [phone]       -  [facility]- [address], [phone], also [address] [phone]       -    [facility]- [address] [place], [phone], 1st & 3rd Saturday   every month, 10am-1pm  1) Find a Doctor and Pay Out of Pocket Although you won't have to find out who is covered by your insurance plan, it is a good idea to ask around and get recommendations. You will then need to call the office and see if the doctor you have chosen will accept you as a new patient and what types of options they offer for patients who are self-pay. Some doctors offer discounts or  will set up payment plans for their patients who do not have insurance, but you will need to ask so you aren't surprised when you get to your appointment.  2) Contact Your Local Health Department Not all health departments have doctors that can see patients for sick visits, but many do, so it is worth a call to see if yours does. If you don't know where your local health department is, you can check in your phone book. The CDC also has a tool to help you locate your state's health department, and many state websites also have listings of all of their local health departments.  3) Find a Walk-in Clinic If your illness is not likely to be very severe or complicated, you may want to try a walk in clinic. These are popping up all over the country in pharmacies, drugstores, and shopping centers. They're usually staffed by nurse practitioners or physician assistants that have been trained to treat common illnesses and complaints. They're usually fairly quick and inexpensive. However, if you have serious medical issues or chronic medical problems, these are probably not your best option  STD Testing - [facility] [place] Department of [facility] [place], STD Clinic, [address] [place], phone [phone] or  [phone].  Monday - Friday, call for an appointment. [facility] Department of [employer], STD Clinic, [address], [place], phone [phone] or [phone].  Monday - Friday, call for an appointment.  Abuse/Neglect: [facility] Child Abuse Hotline 551-804-2487 [facility] Child Abuse Hotline 479-320-6975 (After Hours)  Emergency Shelter:  [facility] 479-427-0701  Maternity Homes: - [facility] at [facility] [place] [facility] (873)829-6322 - [facility] [phone]  MRSA Hotline #:   (931)727-1028  [facility] Resources  [facility] [place]  United Way [facility] [address]                 [address]         [address] [place] [address]  Blondell Reveal Phone:  [phone]                                  Phone:  [phone]                   Phone:  [phone]  [facility], [phone] - [phone]       -     [facility] in [place], [address]                                  [phone], Insurance  [place] Child Abuse Hotline (364)880-2463 or 413-554-0904 (After Hours)   Behavioral Health Services  Substance Abuse Resources: - [facility] and Drug Services  (606)212-6902 - [facility] 678-824-0766 - [facility] (541)173-9538 Floydene Flock [phone] - Residential & Outpatient Substance Abuse Program  [phone]  Psychological Services: [facility]  [phone] Services  [phone] - [facility], [phone] [place]. [address] [place], ACCESS LINE: [phone] or [phone], [email or web address]  Dental Assistance  If unable to pay or uninsured, contact:  [facility] or  [facility]. to become qualified for the adult dental clinic.  Patients with Medicaid: [facility] [phone] [address], [phone] [address] [phone]  If unable to pay, or uninsured, contact [facility] [phone]) or [facility] [phone] in [place], [phone] in [facility]) to become qualified for the adult dental clinic  Other Low-Cost Community Dental Services: - [facility]- [address] [place], [place], [place], [address], [phone], [address], 2nd and 4th Thursday of the month at 6:30am.  10 clients each day by appointment, can sometimes see walk-in patients if someone does not show for an appointment. [facility]- [address] [place], [place], [address], [phone] - [facility]- [address], [place], [place], [address], [phone] - [facility]- [phone] [facility] Health Department- [phone] [facility] Department- [phone]

## 2012-01-15 NOTE — ED Notes (Signed)
[id number]<AV> Expected date:<BR> Expected time: 8:27 PM<BR> Means of arrival:<BR> Comments:<BR> M235 - 50yoF Lupus pain x4 days
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
## 2012-02-15 ENCOUNTER — Encounter: Payer: Self-pay | Admitting: Physical Medicine & Rehabilitation

## 2012-02-15 ENCOUNTER — Encounter: Payer: Medicare Other | Attending: Physical Medicine & Rehabilitation | Admitting: Physical Medicine & Rehabilitation

## 2012-02-15 VITALS — BP 116/83 | HR 73 | Resp 16 | Ht 64.0 in | Wt 177.6 lb

## 2012-02-15 DIAGNOSIS — Z981 Arthrodesis status: Secondary | ICD-10-CM | POA: Insufficient documentation

## 2012-02-15 DIAGNOSIS — F17200 Nicotine dependence, unspecified, uncomplicated: Secondary | ICD-10-CM | POA: Insufficient documentation

## 2012-02-15 DIAGNOSIS — K766 Portal hypertension: Secondary | ICD-10-CM | POA: Insufficient documentation

## 2012-02-15 DIAGNOSIS — F3130 Bipolar disorder, current episode depressed, mild or moderate severity, unspecified: Secondary | ICD-10-CM | POA: Insufficient documentation

## 2012-02-15 DIAGNOSIS — IMO0002 Reserved for concepts with insufficient information to code with codable children: Secondary | ICD-10-CM

## 2012-02-15 DIAGNOSIS — M549 Dorsalgia, unspecified: Secondary | ICD-10-CM

## 2012-02-15 DIAGNOSIS — M47812 Spondylosis without myelopathy or radiculopathy, cervical region: Secondary | ICD-10-CM | POA: Insufficient documentation

## 2012-02-15 DIAGNOSIS — I10 Essential (primary) hypertension: Secondary | ICD-10-CM | POA: Insufficient documentation

## 2012-02-15 DIAGNOSIS — Z9884 Bariatric surgery status: Secondary | ICD-10-CM | POA: Insufficient documentation

## 2012-02-15 DIAGNOSIS — M545 Low back pain, unspecified: Secondary | ICD-10-CM | POA: Insufficient documentation

## 2012-02-15 DIAGNOSIS — F4310 Post-traumatic stress disorder, unspecified: Secondary | ICD-10-CM | POA: Insufficient documentation

## 2012-02-15 DIAGNOSIS — M542 Cervicalgia: Secondary | ICD-10-CM | POA: Insufficient documentation

## 2012-02-15 DIAGNOSIS — M961 Postlaminectomy syndrome, not elsewhere classified: Secondary | ICD-10-CM | POA: Insufficient documentation

## 2012-02-15 DIAGNOSIS — F339 Major depressive disorder, recurrent, unspecified: Secondary | ICD-10-CM

## 2012-02-15 DIAGNOSIS — M329 Systemic lupus erythematosus, unspecified: Secondary | ICD-10-CM | POA: Insufficient documentation

## 2012-02-15 MED ORDER — CARBAMAZEPINE ER 200 MG PO TB12: 200.0000 mg | ORAL_TABLET | Freq: Two times a day (BID) | ORAL | Status: DC

## 2012-02-15 MED ORDER — METHOCARBAMOL 750 MG PO TABS: 750.0000 mg | ORAL_TABLET | Freq: Four times a day (QID) | ORAL | Status: DC | PRN
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
## 2012-02-15 NOTE — Patient Instructions (Signed)
PRACTICE GOOD POSTURE EACH AND EVERY DAY

## 2012-02-16 ENCOUNTER — Telehealth: Payer: Self-pay

## 2012-02-16 NOTE — Telephone Encounter (Signed)
Pt has question regarding paperwork, she would like a copy of her urine drug screen for custody purposes.

## 2012-02-21 ENCOUNTER — Telehealth: Payer: Self-pay | Admitting: Physical Medicine & Rehabilitation

## 2012-02-21 NOTE — Telephone Encounter (Signed)
Let pt know that we have not received her UDS back and she can go to the ED if she would like.

## 2012-02-21 NOTE — Telephone Encounter (Signed)
Have we heard back from UDS?  Can she go to ED?  She is in a lot of pain.

## 2012-02-22 ENCOUNTER — Telehealth: Payer: Self-pay | Admitting: Physical Medicine & Rehabilitation

## 2012-02-22 NOTE — Telephone Encounter (Signed)
Pharmacy said we called and cancelled her RX.  What is going on?

## 2012-02-23 ENCOUNTER — Telehealth: Payer: Self-pay | Admitting: Physical Medicine & Rehabilitation

## 2012-02-23 NOTE — Telephone Encounter (Signed)
Pt aware.

## 2012-02-23 NOTE — Telephone Encounter (Signed)
Please call.

## 2012-02-23 NOTE — Telephone Encounter (Signed)
Rx's were cancelled because pt is being discharged from the practice for having cocaine in her system.  Tried calling pt but she didn't answer and I couldn't leave a message.

## 2012-02-24 ENCOUNTER — Emergency Department (HOSPITAL_COMMUNITY): Payer: Medicare Other

## 2012-02-24 ENCOUNTER — Encounter (HOSPITAL_COMMUNITY): Payer: Self-pay | Admitting: Family Medicine

## 2012-02-24 ENCOUNTER — Emergency Department (HOSPITAL_COMMUNITY)
Admission: EM | Admit: 2012-02-24 | Discharge: 2012-02-24 | Disposition: A | Discharge status: Home / Self Care | Payer: Medicare Other | Attending: Emergency Medicine | Admitting: Emergency Medicine

## 2012-02-24 DIAGNOSIS — Z87891 Personal history of nicotine dependence: Secondary | ICD-10-CM | POA: Insufficient documentation

## 2012-02-24 DIAGNOSIS — I10 Essential (primary) hypertension: Secondary | ICD-10-CM | POA: Insufficient documentation

## 2012-02-24 DIAGNOSIS — Y9301 Activity, walking, marching and hiking: Secondary | ICD-10-CM | POA: Insufficient documentation

## 2012-02-24 DIAGNOSIS — S62101A Fracture of unspecified carpal bone, right wrist, initial encounter for closed fracture: Secondary | ICD-10-CM

## 2012-02-24 DIAGNOSIS — S52509A Unspecified fracture of the lower end of unspecified radius, initial encounter for closed fracture: Secondary | ICD-10-CM | POA: Insufficient documentation

## 2012-02-24 DIAGNOSIS — IMO0002 Reserved for concepts with insufficient information to code with codable children: Secondary | ICD-10-CM | POA: Insufficient documentation

## 2012-02-24 DIAGNOSIS — T07XXXA Unspecified multiple injuries, initial encounter: Secondary | ICD-10-CM

## 2012-02-24 DIAGNOSIS — E7800 Pure hypercholesterolemia, unspecified: Secondary | ICD-10-CM | POA: Insufficient documentation

## 2012-02-24 DIAGNOSIS — Y998 Other external cause status: Secondary | ICD-10-CM | POA: Insufficient documentation

## 2012-02-24 DIAGNOSIS — Y92009 Unspecified place in unspecified non-institutional (private) residence as the place of occurrence of the external cause: Secondary | ICD-10-CM | POA: Insufficient documentation

## 2012-02-24 DIAGNOSIS — M329 Systemic lupus erythematosus, unspecified: Secondary | ICD-10-CM | POA: Insufficient documentation

## 2012-02-24 DIAGNOSIS — W1789XA Other fall from one level to another, initial encounter: Secondary | ICD-10-CM | POA: Insufficient documentation

## 2012-02-24 HISTORY — DX: Pure hypercholesterolemia, unspecified: E78.00

## 2012-02-24 MED ORDER — IBUPROFEN 200 MG PO TABS
600.0000 mg | ORAL_TABLET | Freq: Once | ORAL | Status: DC
  Filled 2012-02-24: qty 3

## 2012-02-24 MED ORDER — OXYCODONE-ACETAMINOPHEN 5-325 MG PO TABS
1.0000 | ORAL_TABLET | Freq: Once | ORAL | Status: AC
  Administered 2012-02-24: 1 via ORAL
  Filled 2012-02-24: qty 1
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
## 2012-02-24 NOTE — ED Notes (Signed)
Patient transported to X-ray 

## 2012-02-24 NOTE — ED Notes (Signed)
Ortho tech at bedside to apply splint to right wrist.
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
## 2012-02-24 NOTE — ED Notes (Signed)
Ibuprofen not given. Patient states that she cannot take any NSAIDs due to having gastric bypass and lupus.

## 2012-02-24 NOTE — ED Notes (Signed)
Returned from xray

## 2012-02-24 NOTE — ED Notes (Signed)
Per PTAR, patient was sitting on railing at her apartment and fell injuring her right wrist.

## 2012-02-25 ENCOUNTER — Encounter (HOSPITAL_COMMUNITY): Payer: Self-pay | Admitting: *Deleted

## 2012-02-25 ENCOUNTER — Emergency Department (HOSPITAL_COMMUNITY)
Admission: EM | Admit: 2012-02-25 | Discharge: 2012-02-25 | Disposition: A | Discharge status: Home / Self Care | Payer: Medicare Other | Attending: Emergency Medicine | Admitting: Emergency Medicine

## 2012-02-25 DIAGNOSIS — Z90710 Acquired absence of both cervix and uterus: Secondary | ICD-10-CM | POA: Insufficient documentation

## 2012-02-25 DIAGNOSIS — S52509A Unspecified fracture of the lower end of unspecified radius, initial encounter for closed fracture: Secondary | ICD-10-CM | POA: Insufficient documentation

## 2012-02-25 DIAGNOSIS — W19XXXA Unspecified fall, initial encounter: Secondary | ICD-10-CM | POA: Insufficient documentation

## 2012-02-25 DIAGNOSIS — Z87891 Personal history of nicotine dependence: Secondary | ICD-10-CM | POA: Insufficient documentation

## 2012-02-25 DIAGNOSIS — S52609A Unspecified fracture of lower end of unspecified ulna, initial encounter for closed fracture: Secondary | ICD-10-CM

## 2012-02-25 DIAGNOSIS — Z833 Family history of diabetes mellitus: Secondary | ICD-10-CM | POA: Insufficient documentation

## 2012-02-25 DIAGNOSIS — E7800 Pure hypercholesterolemia, unspecified: Secondary | ICD-10-CM | POA: Insufficient documentation

## 2012-02-25 DIAGNOSIS — M329 Systemic lupus erythematosus, unspecified: Secondary | ICD-10-CM | POA: Insufficient documentation

## 2012-02-25 DIAGNOSIS — Z8249 Family history of ischemic heart disease and other diseases of the circulatory system: Secondary | ICD-10-CM | POA: Insufficient documentation

## 2012-02-25 DIAGNOSIS — I10 Essential (primary) hypertension: Secondary | ICD-10-CM | POA: Insufficient documentation

## 2012-02-25 MED ORDER — KETOROLAC TROMETHAMINE 30 MG/ML IJ SOLN
30.0000 mg | Freq: Once | INTRAMUSCULAR | Status: AC
  Administered 2012-02-25: 30 mg via INTRAMUSCULAR

## 2012-02-25 MED ORDER — FENTANYL CITRATE 0.05 MG/ML IJ SOLN
50.0000 ug | Freq: Once | INTRAMUSCULAR | Status: AC
  Administered 2012-02-25: 50 ug via NASAL
  Filled 2012-02-25: qty 2

## 2012-02-25 MED ORDER — FENTANYL CITRATE 0.05 MG/ML IJ SOLN
50.0000 ug | Freq: Once | INTRAMUSCULAR | Status: DC
  Filled 2012-02-25: qty 2

## 2012-02-25 MED ORDER — HYDROMORPHONE HCL PF 2 MG/ML IJ SOLN
2.0000 mg | Freq: Once | INTRAMUSCULAR | Status: AC
  Administered 2012-02-25: 2 mg via INTRAMUSCULAR
  Filled 2012-02-25: qty 1

## 2012-02-25 MED ORDER — OXYCODONE-ACETAMINOPHEN 5-325 MG PO TABS: 2.0000 | ORAL_TABLET | ORAL | Status: AC | PRN

## 2012-02-25 MED ORDER — KETOROLAC TROMETHAMINE 30 MG/ML IJ SOLN
30.0000 mg | Freq: Once | INTRAMUSCULAR | Status: DC
  Filled 2012-02-25: qty 1
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
## 2012-02-25 NOTE — ED Provider Notes (Signed)
4:07 PM Patient reports significant pain with worsening subjective swelling and numbness of her right wrist and hand. She reports shooting pains into her right axilla. She "can feel the bones rubbing together" with right wrist and hand movement and the fentanyl medication provides no relief.   Filed Vitals:   02/25/12 1434  BP: 131/81  Pulse: 79  Temp:   Resp: 18   Patient is upset due to pain and discomfort. Regular heart rate and rhythm. Lungs clear bilaterally. Sufficient capillary refill of phalanges bilaterally. Significant swelling and tenderness to palpation of right hand, wrist and forearm. Limited ROM of right elbow, wrist, and phalanges. Decreased strength and sensation of right hand and forearm compared to the left. No signs of vascular compromise at this time.   Plan: Low suspicion for compartment syndrome. Patient was given 2 mg dilaudid for pain control as well as 30mg  of Toradol. She will continue to ice and elevate her right arm. I will ask Dr. Fonnie Jarvis to see her to get his opinion on worsening condition of the patient. If the patient improves, consider d/c home with sugar tong splint with padding. If worse, will contact ortho for consult.  Emilia Beck, PA-C 02/25/12 1950
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
## 2012-02-25 NOTE — ED Provider Notes (Signed)
4:22 PM Dr. Fonnie Jarvis saw the patient and he reports improving condition. He reports improved ROM of right hand and wrist as well as improved sensation to light touch. He will check on her in a few hours.   Sharon Beck, PA-C 02/25/12 1950

## 2012-02-25 NOTE — Progress Notes (Signed)
Orthopedic Tech Progress Note Patient Details:  Sharon Nicholson December 28, 1960 [id number]  Ortho Devices Type of Ortho Device: Sugartong splint Ortho Device/Splint Location: right arm Ortho Device/Splint Interventions: Application   Nikki Dom 02/25/2012, 7:31 PM

## 2012-02-25 NOTE — ED Notes (Signed)
Pt reports being seen here for fall on 8/2 and dc home with no pain meds, having increase in pain, unable to sleep at night.

## 2012-02-25 NOTE — ED Provider Notes (Signed)
Medical screening examination/treatment/procedure(s) were performed by non-physician practitioner and as supervising physician I was immediately available for consultation/collaboration.  Jasmine Awe, MD 02/25/12 [phone]

## 2012-02-25 NOTE — ED Provider Notes (Signed)
7:02 PM  Patient reports subjective increased ROM of right wrist and phalanges. No evidence of vascular compromise and low suspicion for compartment syndrome. Dr. Fonnie Jarvis and I saw the patient, and she would like to go home. Orders placed to ortho tech, who will place a sugar tong splint. Patient will be discharged home with pain medication to last her for 2 days until she follows up with Orthopedics. Plan has been discussed with Dr. Fonnie Jarvis who is agreeable.   Filed Vitals:   02/25/12 1434  BP: 131/81  Pulse: 79  Temp:   Resp: 18   7:47 PM Sugar tong splint has been placed and the patient is feeling better. She has an appointment to see an Orthopedist on Monday. She understands she should return to the ED if symptoms worsen or concerning symptoms arise. I gave her a prescription for dilaudid for the next couple of days for comfort, per Dr. Fonnie Jarvis.   Emilia Beck, PA-C 02/25/12 1949

## 2012-02-29 NOTE — ED Provider Notes (Signed)
Medical screening examination/treatment/procedure(s) were conducted as a shared visit with non-physician practitioner(s) and myself.  I personally evaluated the patient during the encounter  Eris Hannan M Erlean Mealor, MD 02/29/12 1225 

## 2012-02-29 NOTE — ED Provider Notes (Signed)
Medical screening examination/treatment/procedure(s) were conducted as a shared visit with non-physician practitioner(s) and myself.  I personally evaluated the patient during the encounter  Hurman Horn, MD 02/29/12 1226

## 2012-02-29 NOTE — ED Provider Notes (Signed)
Medical screening examination/treatment/procedure(s) were conducted as a shared visit with non-physician practitioner(s) and myself.  I personally evaluated the patient during the encounter  Hurman Horn, MD 02/29/12 1225

## 2012-02-29 NOTE — ED Provider Notes (Signed)
Medical screening examination/treatment/procedure(s) were conducted as a shared visit with non-physician practitioner(s) and myself.  I personally evaluated the patient during the encounter  Miran Kautzman M Sumi Lye, MD 02/29/12 1226 

## 2012-03-03 NOTE — ED Provider Notes (Signed)
Initial H&P performed by attending, and inadvertently signed by PA when she was adding portion of ED Course.  Hurman Horn, MD 03/03/12 [phone]

## 2012-03-19 ENCOUNTER — Ambulatory Visit: Payer: Self-pay | Admitting: Physical Medicine & Rehabilitation

## 2012-03-21 ENCOUNTER — Encounter: Payer: Medicare Other | Attending: Physical Medicine & Rehabilitation | Admitting: Physical Medicine & Rehabilitation

## 2012-03-21 DIAGNOSIS — M329 Systemic lupus erythematosus, unspecified: Secondary | ICD-10-CM | POA: Insufficient documentation

## 2012-03-21 DIAGNOSIS — F3130 Bipolar disorder, current episode depressed, mild or moderate severity, unspecified: Secondary | ICD-10-CM | POA: Insufficient documentation

## 2012-03-21 DIAGNOSIS — M47812 Spondylosis without myelopathy or radiculopathy, cervical region: Secondary | ICD-10-CM | POA: Insufficient documentation

## 2012-03-21 DIAGNOSIS — Z981 Arthrodesis status: Secondary | ICD-10-CM | POA: Insufficient documentation

## 2012-03-21 DIAGNOSIS — M545 Low back pain, unspecified: Secondary | ICD-10-CM | POA: Insufficient documentation

## 2012-03-21 DIAGNOSIS — M542 Cervicalgia: Secondary | ICD-10-CM | POA: Insufficient documentation

## 2012-03-21 DIAGNOSIS — M961 Postlaminectomy syndrome, not elsewhere classified: Secondary | ICD-10-CM | POA: Insufficient documentation

## 2012-03-21 DIAGNOSIS — I10 Essential (primary) hypertension: Secondary | ICD-10-CM | POA: Insufficient documentation

## 2012-03-21 DIAGNOSIS — F17200 Nicotine dependence, unspecified, uncomplicated: Secondary | ICD-10-CM | POA: Insufficient documentation

## 2012-03-21 DIAGNOSIS — Z9884 Bariatric surgery status: Secondary | ICD-10-CM | POA: Insufficient documentation

## 2012-03-21 DIAGNOSIS — K766 Portal hypertension: Secondary | ICD-10-CM | POA: Insufficient documentation

## 2012-03-21 DIAGNOSIS — F4310 Post-traumatic stress disorder, unspecified: Secondary | ICD-10-CM | POA: Insufficient documentation

## 2012-06-17 ENCOUNTER — Emergency Department (HOSPITAL_COMMUNITY): Payer: Medicare Other

## 2012-06-17 ENCOUNTER — Encounter (HOSPITAL_COMMUNITY): Payer: Self-pay | Admitting: *Deleted

## 2012-06-17 ENCOUNTER — Emergency Department (HOSPITAL_COMMUNITY)
Admission: EM | Admit: 2012-06-17 | Discharge: 2012-06-17 | Disposition: A | Discharge status: Home / Self Care | Payer: Medicare Other | Attending: Emergency Medicine | Admitting: Emergency Medicine

## 2012-06-17 DIAGNOSIS — F1010 Alcohol abuse, uncomplicated: Secondary | ICD-10-CM | POA: Insufficient documentation

## 2012-06-17 DIAGNOSIS — Z79899 Other long term (current) drug therapy: Secondary | ICD-10-CM | POA: Insufficient documentation

## 2012-06-17 DIAGNOSIS — M329 Systemic lupus erythematosus, unspecified: Secondary | ICD-10-CM | POA: Insufficient documentation

## 2012-06-17 DIAGNOSIS — Z87442 Personal history of urinary calculi: Secondary | ICD-10-CM | POA: Insufficient documentation

## 2012-06-17 DIAGNOSIS — W19XXXA Unspecified fall, initial encounter: Secondary | ICD-10-CM

## 2012-06-17 DIAGNOSIS — Y9389 Activity, other specified: Secondary | ICD-10-CM | POA: Insufficient documentation

## 2012-06-17 DIAGNOSIS — Y929 Unspecified place or not applicable: Secondary | ICD-10-CM | POA: Insufficient documentation

## 2012-06-17 DIAGNOSIS — F489 Nonpsychotic mental disorder, unspecified: Secondary | ICD-10-CM | POA: Insufficient documentation

## 2012-06-17 DIAGNOSIS — M549 Dorsalgia, unspecified: Secondary | ICD-10-CM | POA: Insufficient documentation

## 2012-06-17 DIAGNOSIS — Z87891 Personal history of nicotine dependence: Secondary | ICD-10-CM | POA: Insufficient documentation

## 2012-06-17 DIAGNOSIS — M25539 Pain in unspecified wrist: Secondary | ICD-10-CM | POA: Insufficient documentation

## 2012-06-17 DIAGNOSIS — S63509A Unspecified sprain of unspecified wrist, initial encounter: Secondary | ICD-10-CM | POA: Insufficient documentation

## 2012-06-17 DIAGNOSIS — I10 Essential (primary) hypertension: Secondary | ICD-10-CM | POA: Insufficient documentation

## 2012-06-17 DIAGNOSIS — E7800 Pure hypercholesterolemia, unspecified: Secondary | ICD-10-CM | POA: Insufficient documentation

## 2012-06-17 DIAGNOSIS — Z87828 Personal history of other (healed) physical injury and trauma: Secondary | ICD-10-CM | POA: Insufficient documentation

## 2012-06-17 DIAGNOSIS — R296 Repeated falls: Secondary | ICD-10-CM | POA: Insufficient documentation

## 2012-06-17 MED ORDER — HYDROCODONE-ACETAMINOPHEN 5-500 MG PO TABS: 1.0000 | ORAL_TABLET | Freq: Four times a day (QID) | ORAL | Status: DC | PRN

## 2012-06-17 NOTE — Progress Notes (Signed)
Orthopedic Tech Progress Note Patient Details:  Sharon Nicholson Oct 27, 1960 [id number]  Patient ID: Sharon Nicholson, female   DOB: Dec 31, 1960, 51 y.o.   MRN: [id number] Viewed order from rn order list  Nikki Dom 06/17/2012, 5:46 PM
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
## 2012-06-17 NOTE — ED Notes (Signed)
To radiology
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
## 2012-06-17 NOTE — ED Notes (Signed)
Ortho at bedside.

## 2012-06-17 NOTE — ED Notes (Signed)
Ortho paged for splint 

## 2012-06-17 NOTE — Progress Notes (Signed)
Orthopedic Tech Progress Note Patient Details:  Sharon Nicholson Apr 15, 1961 [id number]  Ortho Devices Type of Ortho Device: Velcro wrist splint Ortho Device/Splint Location: right wrist Ortho Device/Splint Interventions: Application   Nikki Dom 06/17/2012, 5:46 PM

## 2012-07-14 ENCOUNTER — Emergency Department (HOSPITAL_COMMUNITY)
Admission: EM | Admit: 2012-07-14 | Discharge: 2012-07-14 | Disposition: A | Discharge status: Home / Self Care | Payer: Medicare Other | Attending: Emergency Medicine | Admitting: Emergency Medicine

## 2012-07-14 ENCOUNTER — Emergency Department (HOSPITAL_COMMUNITY): Payer: Medicare Other

## 2012-07-14 ENCOUNTER — Encounter (HOSPITAL_COMMUNITY): Payer: Self-pay | Admitting: Nurse Practitioner

## 2012-07-14 DIAGNOSIS — E7800 Pure hypercholesterolemia, unspecified: Secondary | ICD-10-CM | POA: Insufficient documentation

## 2012-07-14 DIAGNOSIS — IMO0002 Reserved for concepts with insufficient information to code with codable children: Secondary | ICD-10-CM | POA: Insufficient documentation

## 2012-07-14 DIAGNOSIS — Z87828 Personal history of other (healed) physical injury and trauma: Secondary | ICD-10-CM | POA: Insufficient documentation

## 2012-07-14 DIAGNOSIS — Z87891 Personal history of nicotine dependence: Secondary | ICD-10-CM | POA: Insufficient documentation

## 2012-07-14 DIAGNOSIS — Z862 Personal history of diseases of the blood and blood-forming organs and certain disorders involving the immune mechanism: Secondary | ICD-10-CM | POA: Insufficient documentation

## 2012-07-14 DIAGNOSIS — I10 Essential (primary) hypertension: Secondary | ICD-10-CM | POA: Insufficient documentation

## 2012-07-14 DIAGNOSIS — Z7982 Long term (current) use of aspirin: Secondary | ICD-10-CM | POA: Insufficient documentation

## 2012-07-14 DIAGNOSIS — S7000XA Contusion of unspecified hip, initial encounter: Secondary | ICD-10-CM | POA: Insufficient documentation

## 2012-07-14 DIAGNOSIS — F1020 Alcohol dependence, uncomplicated: Secondary | ICD-10-CM | POA: Insufficient documentation

## 2012-07-14 DIAGNOSIS — G43909 Migraine, unspecified, not intractable, without status migrainosus: Secondary | ICD-10-CM

## 2012-07-14 DIAGNOSIS — Z87442 Personal history of urinary calculi: Secondary | ICD-10-CM | POA: Insufficient documentation

## 2012-07-14 DIAGNOSIS — Z79899 Other long term (current) drug therapy: Secondary | ICD-10-CM | POA: Insufficient documentation

## 2012-07-14 MED ORDER — DIPHENHYDRAMINE HCL 50 MG/ML IJ SOLN
25.0000 mg | Freq: Once | INTRAMUSCULAR | Status: DC
  Filled 2012-07-14: qty 1

## 2012-07-14 MED ORDER — METOCLOPRAMIDE HCL 5 MG/ML IJ SOLN
10.0000 mg | Freq: Once | INTRAMUSCULAR | Status: AC
  Administered 2012-07-14: 10 mg via INTRAMUSCULAR

## 2012-07-14 MED ORDER — DEXAMETHASONE SODIUM PHOSPHATE 10 MG/ML IJ SOLN
10.0000 mg | Freq: Once | INTRAMUSCULAR | Status: AC
  Administered 2012-07-14: 10 mg via INTRAMUSCULAR

## 2012-07-14 MED ORDER — METOCLOPRAMIDE HCL 5 MG/ML IJ SOLN
10.0000 mg | Freq: Once | INTRAMUSCULAR | Status: DC
  Filled 2012-07-14: qty 2

## 2012-07-14 MED ORDER — OXYCODONE-ACETAMINOPHEN 7.5-325 MG PO TABS: 1.0000 | ORAL_TABLET | Freq: Four times a day (QID) | ORAL | Status: DC | PRN

## 2012-07-14 MED ORDER — DEXAMETHASONE SODIUM PHOSPHATE 10 MG/ML IJ SOLN
10.0000 mg | Freq: Once | INTRAMUSCULAR | Status: DC
  Filled 2012-07-14: qty 2

## 2012-07-14 MED ORDER — OXYCODONE-ACETAMINOPHEN 5-325 MG PO TABS
2.0000 | ORAL_TABLET | Freq: Once | ORAL | Status: AC
  Administered 2012-07-14: 2 via ORAL
  Filled 2012-07-14: qty 2

## 2012-07-14 MED ORDER — DIPHENHYDRAMINE HCL 50 MG/ML IJ SOLN
25.0000 mg | Freq: Once | INTRAMUSCULAR | Status: AC
  Administered 2012-07-14: 25 mg via INTRAMUSCULAR
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
## 2012-07-14 NOTE — ED Notes (Signed)
Unable to obtain iv access. Pt history of poor peripheral access. Multiple abrasions and bruises to arms also. Will page iv team

## 2012-07-23 ENCOUNTER — Emergency Department (HOSPITAL_COMMUNITY): Payer: Medicare Other

## 2012-07-23 ENCOUNTER — Encounter (HOSPITAL_COMMUNITY): Payer: Self-pay | Admitting: *Deleted

## 2012-07-23 ENCOUNTER — Emergency Department (HOSPITAL_COMMUNITY)
Admission: EM | Admit: 2012-07-23 | Discharge: 2012-07-23 | Disposition: A | Discharge status: Home / Self Care | Payer: Medicare Other | Attending: Emergency Medicine | Admitting: Emergency Medicine

## 2012-07-23 DIAGNOSIS — Z9884 Bariatric surgery status: Secondary | ICD-10-CM | POA: Insufficient documentation

## 2012-07-23 DIAGNOSIS — S6990XA Unspecified injury of unspecified wrist, hand and finger(s), initial encounter: Secondary | ICD-10-CM | POA: Insufficient documentation

## 2012-07-23 DIAGNOSIS — Y92009 Unspecified place in unspecified non-institutional (private) residence as the place of occurrence of the external cause: Secondary | ICD-10-CM | POA: Insufficient documentation

## 2012-07-23 DIAGNOSIS — T07XXXA Unspecified multiple injuries, initial encounter: Secondary | ICD-10-CM | POA: Insufficient documentation

## 2012-07-23 DIAGNOSIS — Z7982 Long term (current) use of aspirin: Secondary | ICD-10-CM | POA: Insufficient documentation

## 2012-07-23 DIAGNOSIS — K766 Portal hypertension: Secondary | ICD-10-CM | POA: Insufficient documentation

## 2012-07-23 DIAGNOSIS — Z9889 Other specified postprocedural states: Secondary | ICD-10-CM | POA: Insufficient documentation

## 2012-07-23 DIAGNOSIS — S59909A Unspecified injury of unspecified elbow, initial encounter: Secondary | ICD-10-CM | POA: Insufficient documentation

## 2012-07-23 DIAGNOSIS — E7800 Pure hypercholesterolemia, unspecified: Secondary | ICD-10-CM | POA: Insufficient documentation

## 2012-07-23 DIAGNOSIS — F1021 Alcohol dependence, in remission: Secondary | ICD-10-CM | POA: Insufficient documentation

## 2012-07-23 DIAGNOSIS — Z87891 Personal history of nicotine dependence: Secondary | ICD-10-CM | POA: Insufficient documentation

## 2012-07-23 DIAGNOSIS — Z9089 Acquired absence of other organs: Secondary | ICD-10-CM | POA: Insufficient documentation

## 2012-07-23 DIAGNOSIS — Z8781 Personal history of (healed) traumatic fracture: Secondary | ICD-10-CM | POA: Insufficient documentation

## 2012-07-23 DIAGNOSIS — Z8659 Personal history of other mental and behavioral disorders: Secondary | ICD-10-CM | POA: Insufficient documentation

## 2012-07-23 DIAGNOSIS — Y33XXXA Other specified events, undetermined intent, initial encounter: Secondary | ICD-10-CM | POA: Insufficient documentation

## 2012-07-23 DIAGNOSIS — M329 Systemic lupus erythematosus, unspecified: Secondary | ICD-10-CM | POA: Insufficient documentation

## 2012-07-23 DIAGNOSIS — T148XXA Other injury of unspecified body region, initial encounter: Secondary | ICD-10-CM

## 2012-07-23 DIAGNOSIS — S6991XA Unspecified injury of right wrist, hand and finger(s), initial encounter: Secondary | ICD-10-CM

## 2012-07-23 DIAGNOSIS — Z90710 Acquired absence of both cervix and uterus: Secondary | ICD-10-CM | POA: Insufficient documentation

## 2012-07-23 DIAGNOSIS — Y939 Activity, unspecified: Secondary | ICD-10-CM | POA: Insufficient documentation

## 2012-07-23 DIAGNOSIS — Z87442 Personal history of urinary calculi: Secondary | ICD-10-CM | POA: Insufficient documentation

## 2012-07-23 DIAGNOSIS — Z79899 Other long term (current) drug therapy: Secondary | ICD-10-CM | POA: Insufficient documentation

## 2012-07-23 DIAGNOSIS — Z87828 Personal history of other (healed) physical injury and trauma: Secondary | ICD-10-CM | POA: Insufficient documentation

## 2012-07-23 MED ORDER — OXYCODONE-ACETAMINOPHEN 5-325 MG PO TABS
1.0000 | ORAL_TABLET | Freq: Once | ORAL | Status: AC
  Administered 2012-07-23: 1 via ORAL
  Filled 2012-07-23: qty 1

## 2012-07-23 MED ORDER — IBUPROFEN 800 MG PO TABS: 800.0000 mg | ORAL_TABLET | Freq: Three times a day (TID) | ORAL | Status: DC

## 2012-07-23 MED ORDER — OXYCODONE-ACETAMINOPHEN 5-325 MG PO TABS: ORAL_TABLET | ORAL | Status: DC

## 2012-07-23 MED ORDER — CYCLOBENZAPRINE HCL 10 MG PO TABS: 10.0000 mg | ORAL_TABLET | Freq: Two times a day (BID) | ORAL | Status: DC | PRN

## 2012-07-23 NOTE — ED Notes (Signed)
Pt care assumed, went in to medicate pt as ordered for pain.  Pt became upset when this nurse told her how many mg of percocet she was ordered.  She started to say that she goes to pain  Mgt for chronic pain and that she usually takes percocet 7.5mg .  Pt made aware that the EDP ordered one percocet 5mg /325mg .  Pt stated "they must not have seen all the bruises on my body" and started to cry.  Pt made aware that this nurse will notify the EDP re her concerns.  Then pt asked when is breakfast served.  She's made aware that a tray needs to be ordered for her and they will be delivered ~after 8am.  She states "you mean they haven't ordered breakfast yet?"  She's made aware that they are not ordered until after 7am.

## 2012-07-23 NOTE — ED Notes (Signed)
Britta Mccreedy EDPA was at bedside.  She's notified re pt's concerns re pain med.  She states that pt was informed that she will be discharged with rxs for pain and muscle relaxants.
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
## 2012-07-23 NOTE — ED Provider Notes (Signed)
Medical screening examination/treatment/procedure(s) were performed by non-physician practitioner and as supervising physician I was immediately available for consultation/collaboration.  John-Adam Kinslie Hove, M.D.     John-Adam Jamilynn Whitacre, MD 07/23/12 1819 
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
## 2012-07-26 NOTE — ED Provider Notes (Signed)
Medical screening examination/treatment/procedure(s) were performed by non-physician practitioner and as supervising physician I was immediately available for consultation/collaboration.  [name], MD 07/26/12 [phone]

## 2012-09-29 ENCOUNTER — Encounter (HOSPITAL_COMMUNITY): Payer: Self-pay | Admitting: Emergency Medicine

## 2012-09-29 ENCOUNTER — Emergency Department (HOSPITAL_COMMUNITY)
Admission: EM | Admit: 2012-09-29 | Discharge: 2012-09-29 | Disposition: A | Discharge status: Home / Self Care | Payer: Medicare Other | Attending: Emergency Medicine | Admitting: Emergency Medicine

## 2012-09-29 DIAGNOSIS — Z8679 Personal history of other diseases of the circulatory system: Secondary | ICD-10-CM | POA: Insufficient documentation

## 2012-09-29 DIAGNOSIS — I10 Essential (primary) hypertension: Secondary | ICD-10-CM | POA: Insufficient documentation

## 2012-09-29 DIAGNOSIS — Z79899 Other long term (current) drug therapy: Secondary | ICD-10-CM | POA: Insufficient documentation

## 2012-09-29 DIAGNOSIS — Z8739 Personal history of other diseases of the musculoskeletal system and connective tissue: Secondary | ICD-10-CM | POA: Insufficient documentation

## 2012-09-29 DIAGNOSIS — Z87891 Personal history of nicotine dependence: Secondary | ICD-10-CM | POA: Insufficient documentation

## 2012-09-29 DIAGNOSIS — M79609 Pain in unspecified limb: Secondary | ICD-10-CM

## 2012-09-29 DIAGNOSIS — Z7982 Long term (current) use of aspirin: Secondary | ICD-10-CM | POA: Insufficient documentation

## 2012-09-29 DIAGNOSIS — Z87828 Personal history of other (healed) physical injury and trauma: Secondary | ICD-10-CM | POA: Insufficient documentation

## 2012-09-29 DIAGNOSIS — I872 Venous insufficiency (chronic) (peripheral): Secondary | ICD-10-CM

## 2012-09-29 DIAGNOSIS — Z87442 Personal history of urinary calculi: Secondary | ICD-10-CM | POA: Insufficient documentation

## 2012-09-29 DIAGNOSIS — E7800 Pure hypercholesterolemia, unspecified: Secondary | ICD-10-CM | POA: Insufficient documentation

## 2012-09-29 DIAGNOSIS — M79604 Pain in right leg: Secondary | ICD-10-CM

## 2012-09-29 DIAGNOSIS — I83893 Varicose veins of bilateral lower extremities with other complications: Secondary | ICD-10-CM | POA: Insufficient documentation

## 2012-09-29 MED ORDER — OXYCODONE-ACETAMINOPHEN 5-325 MG PO TABS: ORAL_TABLET | ORAL | Status: DC

## 2012-09-29 MED ORDER — OXYCODONE-ACETAMINOPHEN 5-325 MG PO TABS
2.0000 | ORAL_TABLET | Freq: Once | ORAL | Status: AC
  Administered 2012-09-29: 2 via ORAL
  Filled 2012-09-29: qty 2

## 2012-09-29 NOTE — Progress Notes (Signed)
VASCULAR LAB PRELIMINARY  PRELIMINARY  PRELIMINARY  PRELIMINARY  Bilateral lower extremity venous Dopplers completed.    Preliminary report:  There is no DVT or SVT noted in the bilateral lower extremities.   KANADY, CANDACE, RVT 09/29/2012, 2:57 PM

## 2012-09-29 NOTE — ED Notes (Signed)
Pt states that she has been up with her son in ICU for 1 month.  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  States that she has had surgery in that knee also.  Denies injury.
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
## 2012-09-30 NOTE — ED Provider Notes (Signed)
Medical screening examination/treatment/procedure(s) were performed by non-physician practitioner and as supervising physician I was immediately available for consultation/collaboration.  Douglas Delo, MD 09/30/12 1547 

## 2012-10-28 ENCOUNTER — Emergency Department (HOSPITAL_COMMUNITY)
Admission: EM | Admit: 2012-10-28 | Discharge: 2012-10-29 | Disposition: A | Discharge status: Other Acute Inpatient Hospital | Payer: Medicare Other | Source: Home / Self Care | Attending: Emergency Medicine | Admitting: Emergency Medicine

## 2012-10-28 ENCOUNTER — Encounter (HOSPITAL_COMMUNITY): Payer: Self-pay | Admitting: Emergency Medicine

## 2012-10-28 DIAGNOSIS — R45851 Suicidal ideations: Secondary | ICD-10-CM

## 2012-10-28 LAB — COMPREHENSIVE METABOLIC PANEL
ALT: 16 U/L (ref 0–35)
AST: 16 U/L (ref 0–37)
Albumin: 3.4 g/dL — ABNORMAL LOW (ref 3.5–5.2)
Alkaline Phosphatase: 116 U/L (ref 39–117)
GFR calc Af Amer: >90 mL/min (ref >90)
Glucose, Bld: 99 mg/dL (ref 70–99)
Potassium: 3.9 mEq/L (ref 3.5–5.1)
Sodium: 141 mEq/L (ref 135–145)
Total Protein: 6.9 g/dL (ref 6.0–8.3)

## 2012-10-28 LAB — RAPID URINE DRUG SCREEN, HOSP PERFORMED
Amphetamines: NOT DETECTED
Benzodiazepines: POSITIVE — AB
Cocaine: POSITIVE — AB
Opiates: NOT DETECTED
Tetrahydrocannabinol: NOT DETECTED

## 2012-10-28 LAB — URINALYSIS, ROUTINE W REFLEX MICROSCOPIC
Leukocytes, UA: NEGATIVE
Protein, ur: NEGATIVE mg/dL
Urobilinogen, UA: 1 mg/dL (ref 0.0–1.0)

## 2012-10-28 LAB — CBC
Hemoglobin: 15.2 g/dL — ABNORMAL HIGH (ref 12.0–15.0)
MCHC: 32.4 g/dL (ref 30.0–36.0)
Platelets: 307 10*3/uL (ref 150–400)

## 2012-10-28 LAB — VALPROIC ACID LEVEL: Valproic Acid Lvl: 210.4 ug/mL (ref 50.0–100.0)

## 2012-10-28 MED ORDER — FENTANYL CITRATE 0.05 MG/ML IJ SOLN
100.0000 ug | Freq: Once | INTRAMUSCULAR | Status: AC
  Administered 2012-10-29: 100 ug via INTRAMUSCULAR
  Filled 2012-10-28: qty 2

## 2012-10-28 MED ORDER — TETANUS-DIPHTH-ACELL PERTUSSIS 5-2.5-18.5 LF-MCG/0.5 IM SUSP
0.5000 mL | Freq: Once | INTRAMUSCULAR | Status: AC
  Administered 2012-10-29: 0.5 mL via INTRAMUSCULAR
  Filled 2012-10-28: qty 0.5

## 2012-10-28 MED ORDER — PANTOPRAZOLE SODIUM 40 MG PO TBEC
40.0000 mg | DELAYED_RELEASE_TABLET | Freq: Every day | ORAL | Status: DC
  Administered 2012-10-29: 40 mg via ORAL
  Filled 2012-10-28: qty 1

## 2012-10-28 MED ORDER — BUPROPION HCL ER (XL) 150 MG PO TB24
150.0000 mg | ORAL_TABLET | Freq: Every morning | ORAL | Status: DC
Start: 2012-10-29 — End: 2012-10-29
  Administered 2012-10-29: 150 mg via ORAL
  Filled 2012-10-28: qty 1

## 2012-10-28 MED ORDER — OXYCODONE-ACETAMINOPHEN 5-325 MG PO TABS
1.0000 | ORAL_TABLET | Freq: Four times a day (QID) | ORAL | Status: DC | PRN
  Administered 2012-10-29 (×3): 2 via ORAL
  Filled 2012-10-28 (×3): qty 2

## 2012-10-28 MED ORDER — ROPINIROLE HCL 0.5 MG PO TABS
0.5000 mg | ORAL_TABLET | Freq: Every day | ORAL | Status: DC
  Administered 2012-10-29: 0.5 mg via ORAL
  Filled 2012-10-28 (×2): qty 1

## 2012-10-28 MED ORDER — ASPIRIN 81 MG PO CHEW
81.0000 mg | CHEWABLE_TABLET | Freq: Every morning | ORAL | Status: DC
  Administered 2012-10-29: 81 mg via ORAL
  Filled 2012-10-28: qty 1

## 2012-10-28 MED ORDER — IBUPROFEN 200 MG PO TABS: 600.0000 mg | ORAL_TABLET | Freq: Three times a day (TID) | ORAL | Status: DC | PRN

## 2012-10-28 MED ORDER — FENTANYL CITRATE 0.05 MG/ML IJ SOLN
100.0000 ug | Freq: Once | INTRAMUSCULAR | Status: AC
  Administered 2012-10-28: 100 ug via INTRAMUSCULAR
  Filled 2012-10-28: qty 2

## 2012-10-28 MED ORDER — ACETAMINOPHEN 325 MG PO TABS: 650.0000 mg | ORAL_TABLET | ORAL | Status: DC | PRN

## 2012-10-28 MED ORDER — SIMVASTATIN 20 MG PO TABS
20.0000 mg | ORAL_TABLET | Freq: Every evening | ORAL | Status: DC
  Administered 2012-10-29: 20 mg via ORAL
  Filled 2012-10-28 (×2): qty 1

## 2012-10-28 MED ORDER — LISINOPRIL 40 MG PO TABS
40.0000 mg | ORAL_TABLET | Freq: Every morning | ORAL | Status: DC
  Administered 2012-10-29: 40 mg via ORAL
  Filled 2012-10-28: qty 1

## 2012-10-28 MED ORDER — CITALOPRAM HYDROBROMIDE 20 MG PO TABS
20.0000 mg | ORAL_TABLET | Freq: Every morning | ORAL | Status: DC
  Administered 2012-10-29: 20 mg via ORAL
  Filled 2012-10-28: qty 1

## 2012-10-28 MED ORDER — LORAZEPAM 1 MG PO TABS
1.0000 mg | ORAL_TABLET | Freq: Three times a day (TID) | ORAL | Status: DC | PRN
  Administered 2012-10-29 (×3): 1 mg via ORAL
  Filled 2012-10-28 (×3): qty 1

## 2012-10-28 NOTE — ED Notes (Signed)
Pt from home.  Took 25 0.5mg  clonazepam and 30 7.5mg  temazepam with unknown amount of etoh.  Pt alert, oriented.  Pt states son died recently and said "I want to be with my son."
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
## 2012-10-28 NOTE — ED Notes (Signed)
Pt speaking with Telepsych. This RN is in the room with the pt. Pt also given Sprite and a popsicle.

## 2012-10-28 NOTE — ED Notes (Signed)
Telepsych request faxed by this RN at [phone]. This RN then spoke with Specialist on Call at [phone].

## 2012-10-28 NOTE — ED Notes (Signed)
YNW:GNFA<OZ> Expected date:10/28/12<BR> Expected time: 6:17 PM<BR> Means of arrival:Ambulance<BR> Comments:<BR> SI Attempt/ OD

## 2012-10-29 ENCOUNTER — Encounter (HOSPITAL_COMMUNITY): Payer: Self-pay

## 2012-10-29 ENCOUNTER — Inpatient Hospital Stay (HOSPITAL_COMMUNITY)
Admission: AD | Admit: 2012-10-29 | Discharge: 2012-11-01 | DRG: 885 | Disposition: A | Discharge status: Home / Self Care | Payer: Medicare Other | Source: Intra-hospital | Attending: Psychiatry | Admitting: Psychiatry

## 2012-10-29 DIAGNOSIS — I10 Essential (primary) hypertension: Secondary | ICD-10-CM | POA: Diagnosis present

## 2012-10-29 DIAGNOSIS — F4310 Post-traumatic stress disorder, unspecified: Secondary | ICD-10-CM | POA: Diagnosis present

## 2012-10-29 DIAGNOSIS — F1410 Cocaine abuse, uncomplicated: Secondary | ICD-10-CM | POA: Diagnosis present

## 2012-10-29 DIAGNOSIS — F339 Major depressive disorder, recurrent, unspecified: Principal | ICD-10-CM | POA: Diagnosis present

## 2012-10-29 DIAGNOSIS — Z79899 Other long term (current) drug therapy: Secondary | ICD-10-CM

## 2012-10-29 DIAGNOSIS — R45851 Suicidal ideations: Secondary | ICD-10-CM

## 2012-10-29 DIAGNOSIS — F1010 Alcohol abuse, uncomplicated: Secondary | ICD-10-CM | POA: Diagnosis present

## 2012-10-29 LAB — VALPROIC ACID LEVEL: Valproic Acid Lvl: 79.9 ug/mL (ref 50.0–100.0)

## 2012-10-29 MED ORDER — CHLORDIAZEPOXIDE HCL 25 MG PO CAPS
50.0000 mg | ORAL_CAPSULE | Freq: Once | ORAL | Status: AC
  Administered 2012-10-29: 50 mg via ORAL
  Filled 2012-10-29: qty 1

## 2012-10-29 MED ORDER — NICOTINE 21 MG/24HR TD PT24
21.0000 mg | MEDICATED_PATCH | Freq: Once | TRANSDERMAL | Status: DC
  Administered 2012-10-29: 21 mg via TRANSDERMAL
  Filled 2012-10-29: qty 1

## 2012-10-29 MED ORDER — TRAZODONE HCL 50 MG PO TABS: 50.0000 mg | ORAL_TABLET | Freq: Every evening | ORAL | Status: DC | PRN

## 2012-10-29 MED ORDER — HYDROXYZINE HCL 50 MG PO TABS
50.0000 mg | ORAL_TABLET | Freq: Four times a day (QID) | ORAL | Status: DC | PRN
  Administered 2012-10-29 – 2012-11-01 (×8): 50 mg via ORAL
  Filled 2012-10-29: qty 1

## 2012-10-29 MED ORDER — CHLORDIAZEPOXIDE HCL 25 MG PO CAPS
25.0000 mg | ORAL_CAPSULE | Freq: Four times a day (QID) | ORAL | Status: DC | PRN
  Administered 2012-10-30 – 2012-10-31 (×4): 25 mg via ORAL
  Filled 2012-10-29: qty 1
  Filled 2012-10-29: qty 2
  Filled 2012-10-29 (×3): qty 1

## 2012-10-29 MED ORDER — ALUM & MAG HYDROXIDE-SIMETH 200-200-20 MG/5ML PO SUSP: 30.0000 mL | ORAL | Status: DC | PRN

## 2012-10-29 MED ORDER — MAGNESIUM HYDROXIDE 400 MG/5ML PO SUSP: 30.0000 mL | Freq: Every day | ORAL | Status: DC | PRN

## 2012-10-29 MED ORDER — LOPERAMIDE HCL 2 MG PO CAPS: 2.0000 mg | ORAL_CAPSULE | ORAL | Status: DC | PRN

## 2012-10-29 MED ORDER — VITAMIN B-1 100 MG PO TABS
100.0000 mg | ORAL_TABLET | Freq: Every day | ORAL | Status: DC
  Administered 2012-10-30 – 2012-11-01 (×3): 100 mg via ORAL
  Filled 2012-10-29 (×4): qty 1

## 2012-10-29 MED ORDER — KETOROLAC TROMETHAMINE 15 MG/ML IJ SOLN
15.0000 mg | Freq: Four times a day (QID) | INTRAMUSCULAR | Status: DC | PRN
  Filled 2012-10-29: qty 1

## 2012-10-29 MED ORDER — ACETAMINOPHEN 325 MG PO TABS
650.0000 mg | ORAL_TABLET | Freq: Four times a day (QID) | ORAL | Status: DC | PRN
  Administered 2012-10-30: 650 mg via ORAL

## 2012-10-29 MED ORDER — KETOROLAC TROMETHAMINE 30 MG/ML IJ SOLN
15.0000 mg | Freq: Four times a day (QID) | INTRAMUSCULAR | Status: DC | PRN
  Administered 2012-10-30 – 2012-11-01 (×8): 15 mg via INTRAMUSCULAR
  Filled 2012-10-29 (×6): qty 1

## 2012-10-29 MED ORDER — ADULT MULTIVITAMIN W/MINERALS CH
1.0000 | ORAL_TABLET | Freq: Every day | ORAL | Status: DC
  Administered 2012-10-29 – 2012-11-01 (×4): 1 via ORAL
  Filled 2012-10-29 (×6): qty 1

## 2012-10-29 MED ORDER — ONDANSETRON 4 MG PO TBDP: 4.0000 mg | ORAL_TABLET | Freq: Four times a day (QID) | ORAL | Status: DC | PRN

## 2012-10-29 NOTE — ED Notes (Signed)
Pt given coke 

## 2012-10-29 NOTE — ED Notes (Signed)
Pt requesting more percocet or ativan to help "knock her out". When pt was told how often she can have her medication. Pt said "I know the doctors want to  Make me suffer even if its just for a little bit.

## 2012-10-29 NOTE — Progress Notes (Signed)
Patient anxious and irritable during admission assessment. Patient denies SI/HI at this time. Patient denies AVH. Patient states "I took 68 klonopin and 30 restoril because I felt like I just wanted to be with my son." Patient verbalizes "my son went into the hospital on Valentine's Day and he never came home, they took him off the vent on March 15th." Patient son had hx of Down Syndrome. Patient lives with mother, states "my mother is 50yo and she is a good support for me."  Patient informed of fall risk status, fall risk assessed "moderate" at this time. Patient verbalizes "I have had 5 back surgeries and I have fallen down with my back pain in the past 6 months." Patient oriented to unit/staff/room. Patient denies any questions/concerns at this time. Patient safe on unit with Q15 minute checks for safety. Will continue to monitor.

## 2012-10-29 NOTE — ED Provider Notes (Addendum)
Patient noted a very elevated valproic acid level. She is here pending psychiatric placement. Poison center was initially contacted the plan was to observe and monitor her without any active treatments regarding her overdose. The patient is awake and alert this morning she does not voice any complaints Filed Vitals:   10/29/12 0705  BP: 113/70  Pulse: 79  Temp: 97.7 F (36.5 C)  Resp: 18    I have ordered a repeat valproic acid level  Celene Kras, MD 10/29/12 0751  Repeat valproic acid level is in normal range, 79.9.  Medically cleared at this point.  Stable for psychiatric placement. 9:49 AM   Celene Kras, MD 10/29/12 [phone]

## 2012-10-29 NOTE — ED Notes (Signed)
Pt given another pillow. 

## 2012-10-29 NOTE — Progress Notes (Signed)
Patient has been accepted at [facility] by Dr. Rasul pending bed availability. 

## 2012-10-29 NOTE — ED Notes (Signed)
Patient walked off unit and had to be redirected back to Unit. Pt stated she wanted to smoke a cigarette.  When pt came back to unit she said she would not leave again. Pt given nicotine  Patch.

## 2012-10-29 NOTE — Progress Notes (Signed)
D: Patient in bed on approach.  Patient states she is miserable.  Patient states she does not understand why she cannot have her rx pain medications.  Patient states she was promised in the ED that all of her medications would be transferred from the emergency department.  Patient states if she does not get her pain medications sh will not be able to walk. Patient states she is not her for drug or alcohol abuse so she does not want to be on the 300 hall.  Patient states she is here because he son passed away 2022-11-05 and she misses him and wants to see him. Patient denies SI/HI and denies AVH.   A: Staff to monitor Q 15 mins for safety.  Encouragement and support offered.  Scheduled medications administered per orders.  Vistaril administered prn for anxiety. R: Patient remains safe on the unit.  Patient watched video for group tonight.  Patient taking administered medicaitons.  Patient appears relaxed after this medications patient in bed resting with eyes closed.
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
## 2012-10-29 NOTE — BHH Counselor (Signed)
Patient accepted to [facility] by Verne Spurr, PA to Dr. [name]. Patients bed assignment is 301-1. EDP notified. Patient's nurse also notified. Call report number is [phone]. Support paperwork completed and faxed to [facility]. Patient is voluntary and will be transported via security.

## 2012-10-29 NOTE — ED Provider Notes (Signed)
6:14 AM PSYCH - DR Penalver recs PSY admit, IVC  Results for orders placed during the hospital encounter of 10/28/12  CBC      Result Value Range   WBC 13.0 (*) 4.0 - 10.5 K/uL   RBC 5.81 (*) 3.87 - 5.11 MIL/uL   Hemoglobin 15.2 (*) 12.0 - 15.0 g/dL   HCT 16.1 (*) 09.6 - 04.5 %   MCV 80.7  78.0 - 100.0 fL   MCH 26.2  26.0 - 34.0 pg   MCHC 32.4  30.0 - 36.0 g/dL   RDW 40.9  81.1 - 91.4 %   Platelets 307  150 - 400 K/uL  COMPREHENSIVE METABOLIC PANEL      Result Value Range   Sodium 141  135 - 145 mEq/L   Potassium 3.9  3.5 - 5.1 mEq/L   Chloride 104  96 - 112 mEq/L   CO2 23  19 - 32 mEq/L   Glucose, Bld 99  70 - 99 mg/dL   BUN 9  6 - 23 mg/dL   Creatinine, Ser 7.82  0.50 - 1.10 mg/dL   Calcium 8.9  8.4 - 95.6 mg/dL   Total Protein 6.9  6.0 - 8.3 g/dL   Albumin 3.4 (*) 3.5 - 5.2 g/dL   AST 16  0 - 37 U/L   ALT 16  0 - 35 U/L   Alkaline Phosphatase 116  39 - 117 U/L   Total Bilirubin 0.1 (*) 0.3 - 1.2 mg/dL   GFR calc non Af Amer >90  >90 mL/min   GFR calc Af Amer >90  >90 mL/min  ETHANOL      Result Value Range   Alcohol, Ethyl (B) <11  0 - 11 mg/dL  ACETAMINOPHEN LEVEL      Result Value Range   Acetaminophen (Tylenol), Serum <15.0  10 - 30 ug/mL  SALICYLATE LEVEL      Result Value Range   Salicylate Lvl <2.0 (*) 2.8 - 20.0 mg/dL  URINE RAPID DRUG SCREEN (HOSP PERFORMED)      Result Value Range   Opiates NONE DETECTED  NONE DETECTED   Cocaine POSITIVE (*) NONE DETECTED   Benzodiazepines POSITIVE (*) NONE DETECTED   Amphetamines NONE DETECTED  NONE DETECTED   Tetrahydrocannabinol NONE DETECTED  NONE DETECTED   Barbiturates NONE DETECTED  NONE DETECTED  URINALYSIS, ROUTINE W REFLEX MICROSCOPIC      Result Value Range   Color, Urine YELLOW  YELLOW   APPearance CLEAR  CLEAR   Specific Gravity, Urine 1.013  1.005 - 1.030   pH 6.0  5.0 - 8.0   Glucose, UA NEGATIVE  NEGATIVE mg/dL   Hgb urine dipstick NEGATIVE  NEGATIVE   Bilirubin Urine NEGATIVE  NEGATIVE   Ketones,  ur NEGATIVE  NEGATIVE mg/dL   Protein, ur NEGATIVE  NEGATIVE mg/dL   Urobilinogen, UA 1.0  0.0 - 1.0 mg/dL   Nitrite NEGATIVE  NEGATIVE   Leukocytes, UA NEGATIVE  NEGATIVE  VALPROIC ACID LEVEL      Result Value Range   Valproic Acid Lvl 210.4 (*) 50.0 - 100.0 ug/mL  ACETAMINOPHEN LEVEL      Result Value Range   Acetaminophen (Tylenol), Serum <15.0  10 - 30 ug/mL  SALICYLATE LEVEL      Result Value Range   Salicylate Lvl <2.0 (*) 2.8 - 20.0 mg/dL   HOLDING VALPROATE   Sharon Nielsen, MD 10/29/12 [phone]

## 2012-10-29 NOTE — ED Notes (Signed)
Act team assessing pt in room.

## 2012-10-29 NOTE — ED Notes (Signed)
Pt given [place] sandwich. Pt yelling I should not be back here with a bunch of crack heads and alcoholics. I should be with my son who is dead. But no body cares people just come in to do their job and throw me back here. Therapeutic communication  Given,  pt has blocking thoughts.

## 2012-10-29 NOTE — Tx Team (Signed)
Initial Interdisciplinary Treatment Plan  PATIENT STRENGTHS: (choose at least two) General fund of knowledge Supportive family/friends  PATIENT STRESSORS: Health problems Traumatic event   PROBLEM LIST: Problem List/Patient Goals Date to be addressed Date deferred Reason deferred Estimated date of resolution  Suicide Ideations      Substance Abuse                                                 DISCHARGE CRITERIA:  Medical problems require only outpatient monitoring Need for constant or close observation no longer present  PRELIMINARY DISCHARGE PLAN: Attend aftercare/continuing care group Attend 12-step recovery group Outpatient therapy  PATIENT/FAMIILY INVOLVEMENT: This treatment plan has been presented to and reviewed with the patient, Rachelle Edwards, and/or family member, .  The patient and family have been given the opportunity to ask questions and make suggestions.  Noah Charon 10/29/2012, 6:28 PM

## 2012-10-29 NOTE — ED Notes (Signed)
Pt given something to drink.  °

## 2012-10-30 ENCOUNTER — Encounter (HOSPITAL_COMMUNITY): Payer: Self-pay | Admitting: Psychiatry

## 2012-10-30 DIAGNOSIS — F3130 Bipolar disorder, current episode depressed, mild or moderate severity, unspecified: Secondary | ICD-10-CM

## 2012-10-30 DIAGNOSIS — F1410 Cocaine abuse, uncomplicated: Secondary | ICD-10-CM

## 2012-10-30 DIAGNOSIS — F1010 Alcohol abuse, uncomplicated: Secondary | ICD-10-CM

## 2012-10-30 DIAGNOSIS — F4310 Post-traumatic stress disorder, unspecified: Secondary | ICD-10-CM

## 2012-10-30 MED ORDER — NICOTINE 21 MG/24HR TD PT24
21.0000 mg | MEDICATED_PATCH | Freq: Every day | TRANSDERMAL | Status: DC
  Administered 2012-10-30 – 2012-11-01 (×3): 21 mg via TRANSDERMAL
  Filled 2012-10-30 (×3): qty 1

## 2012-10-30 MED ORDER — ZOLPIDEM TARTRATE 5 MG PO TABS
5.0000 mg | ORAL_TABLET | Freq: Every evening | ORAL | Status: DC | PRN
  Administered 2012-10-30 – 2012-10-31 (×2): 5 mg via ORAL
  Filled 2012-10-30 (×2): qty 1

## 2012-10-30 MED ORDER — GABAPENTIN 300 MG PO CAPS
300.0000 mg | ORAL_CAPSULE | Freq: Three times a day (TID) | ORAL | Status: DC
  Administered 2012-10-30 – 2012-11-01 (×6): 300 mg via ORAL
  Filled 2012-10-30 (×2): qty 1
  Filled 2012-10-30: qty 42
  Filled 2012-10-30 (×2): qty 1
  Filled 2012-10-30: qty 42
  Filled 2012-10-30 (×3): qty 1
  Filled 2012-10-30: qty 42
  Filled 2012-10-30 (×2): qty 1
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
## 2012-10-30 NOTE — Progress Notes (Signed)
D: Patient at the medications window on first approach tonight.  Patient states she had a better today.  Patient states she had a better day today.  Patient states her pain has been better today because she has had some medications.  Patient states she made a huge mistake buy overdosing.  Patient states, "I will never do anything stupid like that again."  Patient denies SI/HI and denies AVH. A: Staff to monitor Q 15 mins for safety.  Encouragement and support offered.  No scheduled medications administered tonight.  Vistaril and librium administered prn for anxiety and withdrawals.  Toradol administered prn for back pain. R: Patient remains safe on the unit.  Patient attended AA/NA group tonight.  Patient taking administered medications.  Patient calm and cooperative.  Patient visible on the unit and interacting with peers.

## 2012-10-30 NOTE — Progress Notes (Signed)
[facility] LCSW Aftercare Discharge Planning Group Note   10/30/2012 8:45 AM  Participation Quality:  Did Not Attend; unable to awaken  Harrill, Julious Payer
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
## 2012-10-30 NOTE — BHH Suicide Risk Assessment (Signed)
BHH INPATIENT:  Family/Significant Other Suicide Prevention Education  Suicide Prevention Education:  Contact Attempts: Patient's mother Lewanda [address] at [phone]  has been identified by the patient as the family member/significant other with whom the patient will be residing, and identified as the person(s) who will aid the patient in the event of a mental health crisis.  With written consent from the patient, two attempts were made to provide suicide prevention education, prior to and/or following the patient's discharge.  We were unsuccessful in providing suicide prevention education.  A suicide education pamphlet was given to the patient to share with family/significant other.  Date and time of first attempt: 10/30/2012 5:51 PM  Date and time of second attempt: 10/31/2012 12:20 PM   Clide Dales 10/31/2012 12:22 PM
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
## 2012-10-30 NOTE — Progress Notes (Signed)
[facility] LCSW Group Therapy  10/30/2012 1:15 PM  Type of Therapy:  Group Therapy  Participation Level:  Active  Participation Quality:  Appropriate, Attentive and Sharing  Affect:  Appropriate  Cognitive:  Appropriate  Insight:  Developing  Engagement in Therapy:  Engaged  Modes of Intervention:  Discussion, Education, Rapport Building, Socialization and Support  Summary of Progress/Problems: Patient attended group presentation by staff member of  [facility] ([facility]). Sharon Nicholson was attentive and appropriate during session. She was able to share how important it is to have appropriate people in your life who can be there when needed as she just lost her son 10/06/12.   Clide Dales

## 2012-10-30 NOTE — Progress Notes (Signed)
D: Patient denies SI/HI and A/V hallucinations; patient reports sleep is fair and reported by night nurse that she slept all night and patient currently slept a few hours on oncoming shift; reports appetite is poor ; reports energy level is low ; reports ability to pay attention is improving; rates depression as 7/10; rates hopelessness 7/10; and has complaints of anxiety; patient also complains of some nausea, aches and pain, and some agitation  A: Monitored q 15 minutes; patient encouraged to attend groups; patient educated about medications; patient given medications per physician orders; patient encouraged to express feelings and/or concerns; prescribed librium as needed  R: Patient is agitated but overall she is cooperative; patient's interaction with staff and peers is minimal unless she is in need of something;patient is taking medications as prescribed and tolerating medications; patient is attending some groups; patient can be attention seeking

## 2012-10-30 NOTE — Progress Notes (Signed)
Pt attended AA group in its entirety.  

## 2012-10-30 NOTE — Progress Notes (Signed)
Grief and Loss Group  Held group on grief and loss. Patients discussed loss of loved ones to death and the loss of self due to many different circumstances.  Patients also discussed coping skills for loss and grief.  Pt shared about the loss of her 50 year old son after his month-long hospitalization.  Pt became tearful and stated wishes that she could hold son again.  Pt disclosed attempting to kill herself but stopping after hearing her son's voice call out to her.  Pt stated that she has another son for who she wants to live.  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  Alvia Grove Counseling Intern [employer]

## 2012-10-31 DIAGNOSIS — F329 Major depressive disorder, single episode, unspecified: Secondary | ICD-10-CM

## 2012-10-31 MED ORDER — ALBUTEROL SULFATE HFA 108 (90 BASE) MCG/ACT IN AERS: 2.0000 | INHALATION_SPRAY | Freq: Four times a day (QID) | RESPIRATORY_TRACT | Status: DC | PRN

## 2012-10-31 MED ORDER — SIMVASTATIN 20 MG PO TABS
20.0000 mg | ORAL_TABLET | Freq: Every evening | ORAL | Status: DC
Start: 2012-10-31 — End: 2012-11-01
  Administered 2012-10-31: 20 mg via ORAL
  Filled 2012-10-31: qty 3
  Filled 2012-10-31 (×2): qty 1

## 2012-10-31 MED ORDER — ALBUTEROL SULFATE (5 MG/ML) 0.5% IN NEBU: 2.5000 mg | INHALATION_SOLUTION | Freq: Four times a day (QID) | RESPIRATORY_TRACT | Status: DC | PRN

## 2012-10-31 MED ORDER — LISINOPRIL 20 MG PO TABS
20.0000 mg | ORAL_TABLET | Freq: Every day | ORAL | Status: DC
Start: 2012-10-31 — End: 2012-11-01
  Administered 2012-10-31 – 2012-11-01 (×2): 20 mg via ORAL
  Filled 2012-10-31: qty 1
  Filled 2012-10-31: qty 3
  Filled 2012-10-31 (×2): qty 1

## 2012-10-31 MED ORDER — BUPROPION HCL ER (XL) 150 MG PO TB24
150.0000 mg | ORAL_TABLET | Freq: Every day | ORAL | Status: DC
  Administered 2012-10-31 – 2012-11-01 (×2): 150 mg via ORAL
  Filled 2012-10-31: qty 1
  Filled 2012-10-31: qty 14
  Filled 2012-10-31 (×2): qty 1

## 2012-10-31 MED ORDER — DIVALPROEX SODIUM ER 500 MG PO TB24
500.0000 mg | ORAL_TABLET | Freq: Every evening | ORAL | Status: DC
  Administered 2012-10-31: 500 mg via ORAL
  Filled 2012-10-31 (×2): qty 1

## 2012-10-31 MED ORDER — ASPIRIN 81 MG PO CHEW
81.0000 mg | CHEWABLE_TABLET | Freq: Every morning | ORAL | Status: DC
  Administered 2012-10-31 – 2012-11-01 (×2): 81 mg via ORAL
  Filled 2012-10-31 (×3): qty 1
  Filled 2012-10-31: qty 3

## 2012-10-31 MED ORDER — ROPINIROLE HCL 0.25 MG PO TABS
0.5000 mg | ORAL_TABLET | Freq: Every day | ORAL | Status: DC
  Administered 2012-10-31: 0.5 mg via ORAL
  Filled 2012-10-31: qty 6
  Filled 2012-10-31 (×2): qty 1

## 2012-10-31 MED ORDER — PANTOPRAZOLE SODIUM 40 MG PO TBEC
40.0000 mg | DELAYED_RELEASE_TABLET | Freq: Every day | ORAL | Status: DC
  Administered 2012-10-31 – 2012-11-01 (×2): 40 mg via ORAL
  Filled 2012-10-31 (×3): qty 1

## 2012-10-31 MED ORDER — DIVALPROEX SODIUM ER 250 MG PO TB24
250.0000 mg | ORAL_TABLET | Freq: Every evening | ORAL | Status: DC
  Administered 2012-10-31: 250 mg via ORAL
  Filled 2012-10-31 (×2): qty 1

## 2012-10-31 NOTE — Progress Notes (Signed)
Chaplain saw pt on rounds - familiar with pt from previous admission and hospitalization of pt's son.   Provided support with pt's grief around loss of son in February after extended hospitalization in ICU at [facility].  Pt reported that upon arriving home, she began having to care for others and has not had space to grieve loss.  Pt and chaplain engaged in review of pt's relationship with son and ways in which she had offered her life for him.  Pt's care for son was consuming and she feels she is unable to conceptualize what life is like now without him.   Pt spoke with chaplain about things her son taught her that remain with her.   Chaplain provided grief education and worked to normalize feelings.   Will continue to follow during admission.  Please page as needs arise.    Belva Crome  MDiv,  10/31/2012 3:42 PM

## 2012-10-31 NOTE — Tx Team (Signed)
Interdisciplinary Treatment Plan Update (Adult)  Date: 10/31/2012  Time Reviewed: 9:43 AM   Progress in Treatment: Attending groups: Yes Participating in groups: Yes Taking medication as prescribed:  Yes Tolerating medication:  Yes Family/Significant othe contact made: Not as yet; two attempts made to mother Patient understands diagnosis: Yes Discussing patient identified problems/goals with staff: Yes Medical problems stabilized or resolved:  Yes Denies suicidal/homicidal ideation: Yes Patient has not harmed self or Others: Yes  New problem(s) identified: None Identified  Discharge Plan or Barriers:  CSW is assessing for appropriate referrals.  Will also be contacting cousin in [place] who has arranged for pt to move there.   Additional comments: N/A  Reason for Continuation of Hospitalization: Anxiety Depression Medication stabilization   Estimated length of stay: 1 day  For review of initial/current patient goals, please see plan of care.  Attendees: Patient:     Family:     Physician:  [name] 10/31/2012 9:43 AM   Nursing:   Burnetta Sabin, RN 10/31/2012 9:43 AM   Clinical Social Worker Ronda Fairly 10/31/2012 9:43 AM   Other:  Leland Johns Intern 10/31/2012 9:43 AM   Other:  Georgina Quint, [place] PA Student 10/31/2012 9:43 AM   Other: Serena Colonel, PA 10/31/2012 9:43 AM   Other:   Maryjean Morn, PA 10/31/2012 9:43 AM    Scribe for Treatment Team:   Carney Bern, LCSWA  10/31/2012 9:43 AM
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
## 2012-10-31 NOTE — [facility] Suicide Risk Assessment (Signed)
[facility] INPATIENT:  Family/Significant Other Suicide Prevention Education  Suicide Prevention Education:  Education Completed; Cleatis Polka at [phone] has been identified by the patient as the family member/significant other with whom the patient will be residing, and identified as the person(s) who will aid the patient in the event of a mental health crisis (suicidal ideations/suicide attempt).  With written consent from the patient, the family member/significant other has been provided the following suicide prevention education, prior to the and/or following the discharge of the patient.  The suicide prevention education provided includes the following:  Suicide risk factors  Suicide prevention and interventions  National Suicide Hotline telephone number  [facility] assessment telephone number  [facility] Emergency Assistance 911  [facility] and/or Residential Mobile Crisis Unit telephone number  Request made of family/significant other to:  Remove weapons (e.g., guns, rifles, knives), all items previously/currently identified as safety concern.    Remove drugs/medications (over-the-counter, prescriptions, illicit drugs), all items previously/currently identified as a safety concern.  The family member/significant other verbalizes understanding of the suicide prevention education information provided.  The family member/significant other agrees to remove the items of safety concern listed above.  Clide Dales 10/31/2012, 1:06 PM

## 2012-10-31 NOTE — Progress Notes (Signed)
D: Patient denies SI/HI and A/V hallucinations; patient is very anxious; patient complains of depression and grief over her son  A: Monitored q 15 minutes; patient encouraged to attend groups; patient educated about medications; patient given medications per physician orders; patient encouraged to express feelings and/or concerns  R: Patient is very anxious and needs redirection when upset;patient requires a lot of attention; patient is  patient's interaction with staff and peers is appropriate and she has been observed laughing and joking with peers and participating in activities with her peers; patient requires frequent interventions; patient is taking medications as prescribed and tolerating medications; patient is attending all groups

## 2012-10-31 NOTE — [facility] Group Notes (Signed)
[facility] LCSW Group Therapy  10/31/2012  1:15 PM  Type of Therapy:  Group Therapy 1:15 to 2:30   Participation Level:  Active  Participation Quality: Attentive, Drowsy and Sharing  Affect:  Flat and Irritable  Cognitive:  Alert and Oriented  Insight:  Developing/Improving  Engagement in Therapy:  Developing/Improving  Modes of Intervention:  Clarification, Discussion, Exploration, Reality Testing, Socialization and Support  Summary of Progress/Problems: The focus of this group session was to process how we deal with difficult emotions and share with others the patterns that play out when we are reacting to the emotion verses the situation. Carolin shared that one of the most difficult emotions she deals with is fear in addition to her current grief process. Izabellah was able to process the reality that she keeps things to herself; "That way you can't hurt me with something I told you later."  She is beginning to understand that that is not going to work so well with current grieving process.    Clide Dales

## 2012-10-31 NOTE — [facility] Group Notes (Addendum)
[facility] LCSW Aftercare Discharge Planning Group Note   10/31/2012 8:45 AM  Participation Quality:  Appropriate  Mood/Affect:  Depressed  Depression Rating:  7  Anxiety Rating:  5  Thoughts of Suicide:  No Will you contract for safety?   NA  Current AVH:  No  Plan for Discharge/Comments:  Patient to discharge home to mom and relocate with mom to [facility] for new job opportunity  [employer]:  Family  Supports: Mother, cousin, aunt and mulitiple other family members   Dyane Dustman, Julious Payer

## 2012-11-01 DIAGNOSIS — F339 Major depressive disorder, recurrent, unspecified: Secondary | ICD-10-CM

## 2012-11-01 DIAGNOSIS — F4310 Post-traumatic stress disorder, unspecified: Secondary | ICD-10-CM

## 2012-11-01 MED ORDER — DIVALPROEX SODIUM ER 250 MG PO TB24: ORAL_TABLET | ORAL | Status: AC

## 2012-11-01 MED ORDER — BUPROPION HCL ER (XL) 150 MG PO TB24: 150.0000 mg | ORAL_TABLET | Freq: Every day | ORAL | Status: DC

## 2012-11-01 MED ORDER — ALBUTEROL SULFATE HFA 108 (90 BASE) MCG/ACT IN AERS: 2.0000 | INHALATION_SPRAY | Freq: Four times a day (QID) | RESPIRATORY_TRACT | Status: AC | PRN

## 2012-11-01 MED ORDER — ASPIRIN 81 MG PO CHEW: 81.0000 mg | CHEWABLE_TABLET | Freq: Every morning | ORAL | Status: DC

## 2012-11-01 MED ORDER — OMEPRAZOLE 40 MG PO CPDR: 40.0000 mg | DELAYED_RELEASE_CAPSULE | Freq: Every morning | ORAL | Status: DC

## 2012-11-01 MED ORDER — ALBUTEROL SULFATE (2.5 MG/3ML) 0.083% IN NEBU: 2.5000 mg | INHALATION_SOLUTION | RESPIRATORY_TRACT | Status: DC | PRN

## 2012-11-01 MED ORDER — TEMAZEPAM 30 MG PO CAPS: 30.0000 mg | ORAL_CAPSULE | Freq: Every evening | ORAL | Status: DC | PRN

## 2012-11-01 MED ORDER — ROPINIROLE HCL 0.5 MG PO TABS: 0.5000 mg | ORAL_TABLET | Freq: Every day | ORAL | Status: AC

## 2012-11-01 MED ORDER — GABAPENTIN 300 MG PO CAPS: 300.0000 mg | ORAL_CAPSULE | Freq: Three times a day (TID) | ORAL | Status: DC

## 2012-11-01 MED ORDER — SIMVASTATIN 20 MG PO TABS: 20.0000 mg | ORAL_TABLET | Freq: Every evening | ORAL | Status: AC

## 2012-11-01 MED ORDER — LISINOPRIL 20 MG PO TABS: 20.0000 mg | ORAL_TABLET | Freq: Every morning | ORAL | Status: DC

## 2012-11-01 MED ORDER — DIVALPROEX SODIUM ER 250 MG PO TB24
750.0000 mg | ORAL_TABLET | Freq: Every evening | ORAL | Status: DC
  Filled 2012-11-01: qty 42
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
## 2012-11-01 NOTE — [facility] Group Notes (Signed)
[facility] LCSW Aftercare Discharge Planning Group Note   11/01/2012 9:59 AM  Participation Quality:  Appropriate  Mood/Affect:  Anxious and Appropriate  Depression Rating:  4  Anxiety Rating:  3  Thoughts of Suicide:  No Will you contract for safety?   NA  Current AVH:  No  Plan for Discharge/Comments:  Patient discharging home today, will follow up at [facility] next week once she returns from [facility] for job interview finalization.  Transportation Means:  Cab "[employer]", self pay  Supports: Multiple family members, hospice  Vayda Dungee, Julious Payer

## 2012-11-01 NOTE — Progress Notes (Signed)
Sharon Nicholson was discharged home today.  Followup information, prescriptions, sample medications and discharge instructions given and patient verbalized understanding.  Personal belongings returned to patient from locker 65.

## 2012-11-01 NOTE — Progress Notes (Signed)
Patient ID: Sharon Nicholson, female   DOB: 02-08-1961, 52 y.o.   MRN: [id number] D: pt. Loud, demanding, intrusive as Clinical research associate interacts with another client. "I need to get my medicine." A: Writer introduces self to client and ask her to wait until she finish speaking with another client. Writer address pt. Concerns and administers meds (see MAR). Staff encouraged client to attend group. Staff monitors q8min for safety. R: Pt. Is safe on the unit. Pt. Attends group.
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
## 2012-11-01 NOTE — Progress Notes (Signed)
[facility] Adult Case Management Discharge Plan :  Will you be returning to the same living situation after discharge: Yes,  with mother; she and mother will be relocating to [place] At discharge, do you have transportation home?:Yes,  home Do you have the ability to pay for your medications:Yes,  patient   Release of information consent forms completed and in the chart;  Patient's signature needed at discharge.  Patient to Follow up at: Follow-up Information   Follow up with [facility] On 11/06/2012. (Go to [facility] on Tuesday 4/15 between the hours of 8AM and 3PM for initial assessment)    Contact information:   [address] [place], [place] [address] [facility] [phone] Valinda Hoar [phone]      Patient denies SI/HI:   Yes,  denies both    Safety Planning and Suicide Prevention discussed:  Yes,  with mother  Clide Dales 11/01/2012, 10:02 AM

## 2012-11-06 NOTE — Progress Notes (Signed)
Patient Discharge Instructions:  After Visit Summary (AVS):   Faxed to:  11/06/12 Discharge Summary Note:   Faxed to:  11/06/12 Psychiatric Admission Assessment Note:   Faxed to:  11/06/12 Suicide Risk Assessment - Discharge Assessment:   Faxed to:  11/06/12 Faxed/Sent to the Next Level Care provider:  11/06/12 Faxed to [facility] @ [phone]  Jerelene Redden, 11/06/2012, 4:06 PM

## 2012-12-15 ENCOUNTER — Emergency Department (HOSPITAL_COMMUNITY)
Admission: EM | Admit: 2012-12-15 | Discharge: 2012-12-15 | Disposition: A | Discharge status: Home / Self Care | Payer: Medicare Other | Attending: Emergency Medicine | Admitting: Emergency Medicine

## 2012-12-15 ENCOUNTER — Encounter (HOSPITAL_COMMUNITY): Payer: Self-pay | Admitting: Emergency Medicine

## 2012-12-15 DIAGNOSIS — Z87828 Personal history of other (healed) physical injury and trauma: Secondary | ICD-10-CM | POA: Insufficient documentation

## 2012-12-15 DIAGNOSIS — IMO0002 Reserved for concepts with insufficient information to code with codable children: Secondary | ICD-10-CM | POA: Insufficient documentation

## 2012-12-15 DIAGNOSIS — N949 Unspecified condition associated with female genital organs and menstrual cycle: Secondary | ICD-10-CM | POA: Insufficient documentation

## 2012-12-15 DIAGNOSIS — F1021 Alcohol dependence, in remission: Secondary | ICD-10-CM | POA: Insufficient documentation

## 2012-12-15 DIAGNOSIS — Z9089 Acquired absence of other organs: Secondary | ICD-10-CM | POA: Insufficient documentation

## 2012-12-15 DIAGNOSIS — Z87442 Personal history of urinary calculi: Secondary | ICD-10-CM | POA: Insufficient documentation

## 2012-12-15 DIAGNOSIS — R109 Unspecified abdominal pain: Secondary | ICD-10-CM | POA: Insufficient documentation

## 2012-12-15 DIAGNOSIS — Z9884 Bariatric surgery status: Secondary | ICD-10-CM | POA: Insufficient documentation

## 2012-12-15 DIAGNOSIS — Z8739 Personal history of other diseases of the musculoskeletal system and connective tissue: Secondary | ICD-10-CM | POA: Insufficient documentation

## 2012-12-15 DIAGNOSIS — Z87891 Personal history of nicotine dependence: Secondary | ICD-10-CM | POA: Insufficient documentation

## 2012-12-15 DIAGNOSIS — R1032 Left lower quadrant pain: Secondary | ICD-10-CM

## 2012-12-15 DIAGNOSIS — I10 Essential (primary) hypertension: Secondary | ICD-10-CM | POA: Insufficient documentation

## 2012-12-15 DIAGNOSIS — E7800 Pure hypercholesterolemia, unspecified: Secondary | ICD-10-CM | POA: Insufficient documentation

## 2012-12-15 DIAGNOSIS — Z79899 Other long term (current) drug therapy: Secondary | ICD-10-CM | POA: Insufficient documentation

## 2012-12-15 DIAGNOSIS — Z90710 Acquired absence of both cervix and uterus: Secondary | ICD-10-CM | POA: Insufficient documentation

## 2012-12-15 DIAGNOSIS — IMO0001 Reserved for inherently not codable concepts without codable children: Secondary | ICD-10-CM | POA: Insufficient documentation

## 2012-12-15 LAB — URINALYSIS, ROUTINE W REFLEX MICROSCOPIC
Glucose, UA: NEGATIVE mg/dL
Hgb urine dipstick: NEGATIVE
Ketones, ur: 15 mg/dL — AB
Protein, ur: NEGATIVE mg/dL
Urobilinogen, UA: 1 mg/dL (ref 0.0–1.0)

## 2012-12-15 LAB — URINE MICROSCOPIC-ADD ON

## 2012-12-15 MED ORDER — HYDROCODONE-ACETAMINOPHEN 5-325 MG PO TABS
1.0000 | ORAL_TABLET | Freq: Once | ORAL | Status: AC
  Administered 2012-12-15: 1 via ORAL
  Filled 2012-12-15: qty 1

## 2012-12-15 MED ORDER — HYDROCODONE-ACETAMINOPHEN 5-325 MG PO TABS: 1.0000 | ORAL_TABLET | Freq: Three times a day (TID) | ORAL | Status: AC | PRN

## 2012-12-15 MED ORDER — HYDROMORPHONE HCL PF 1 MG/ML IJ SOLN
1.0000 mg | Freq: Once | INTRAMUSCULAR | Status: AC
  Administered 2012-12-15: 1 mg via INTRAVENOUS
  Filled 2012-12-15: qty 1

## 2012-12-15 NOTE — ED Notes (Signed)
Per ems- pt reports left groin pain for past 3 days pain 8/10 pain does not radiate. Pt with hx of kidney stones and thinks this may be the issue. Last stone 6 mo ago. Vs stable bp 138/90 hr 80 r- 16. Pt in nad.

## 2012-12-15 NOTE — ED Notes (Signed)
Pt comfortable with d/c and f/u instructions. Prescriptions x1 
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
## 2012-12-17 LAB — URINE CULTURE: Colony Count: 10000

## 2013-01-22 IMAGING — CR DG KNEE COMPLETE 4+V*R*
4 series · 4 of 4 positions shown · non-contrast
Comparison: None.

CLINICAL DATA: Fall today at medial right knee pain.

RIGHT KNEE - COMPLETE 4+ VIEW

[t knee ap right]
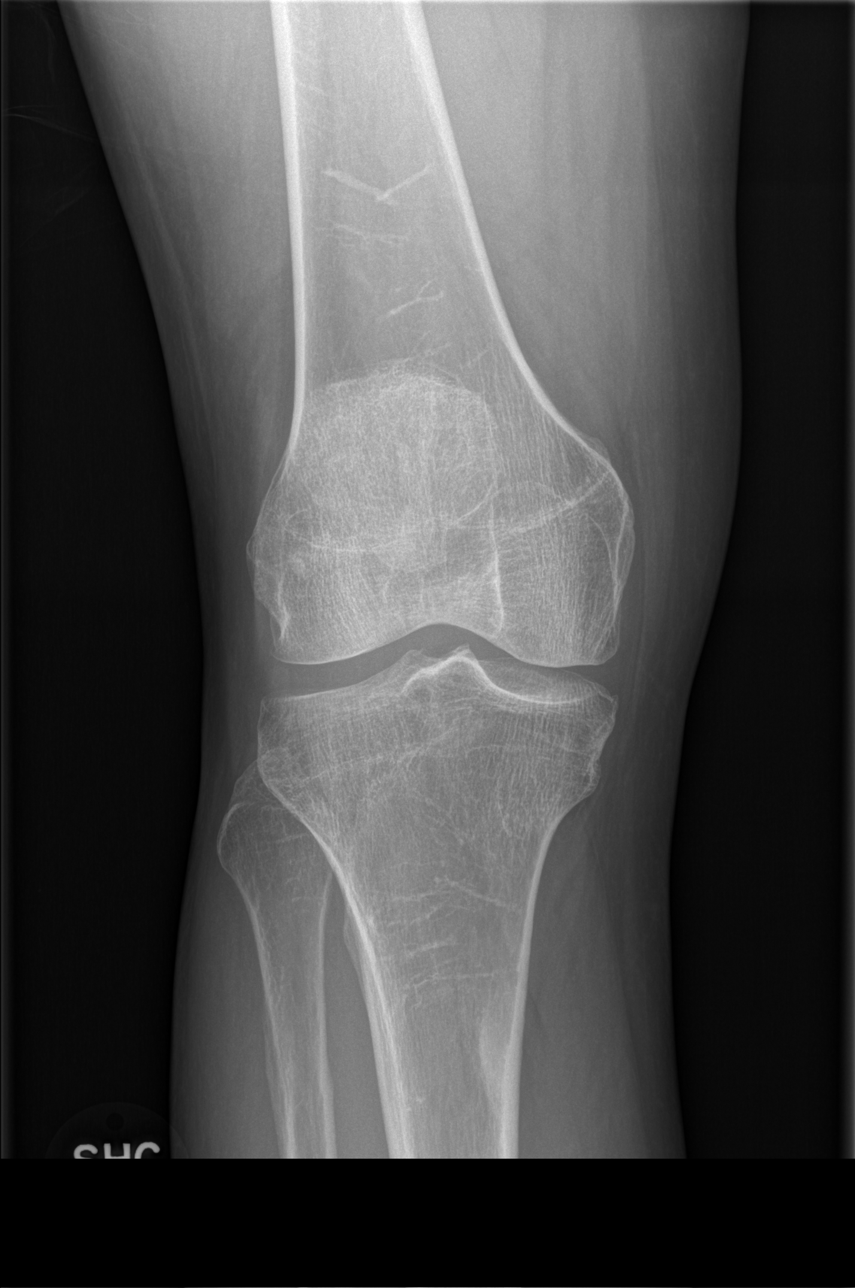

[t knee obl right (1 of 2)]
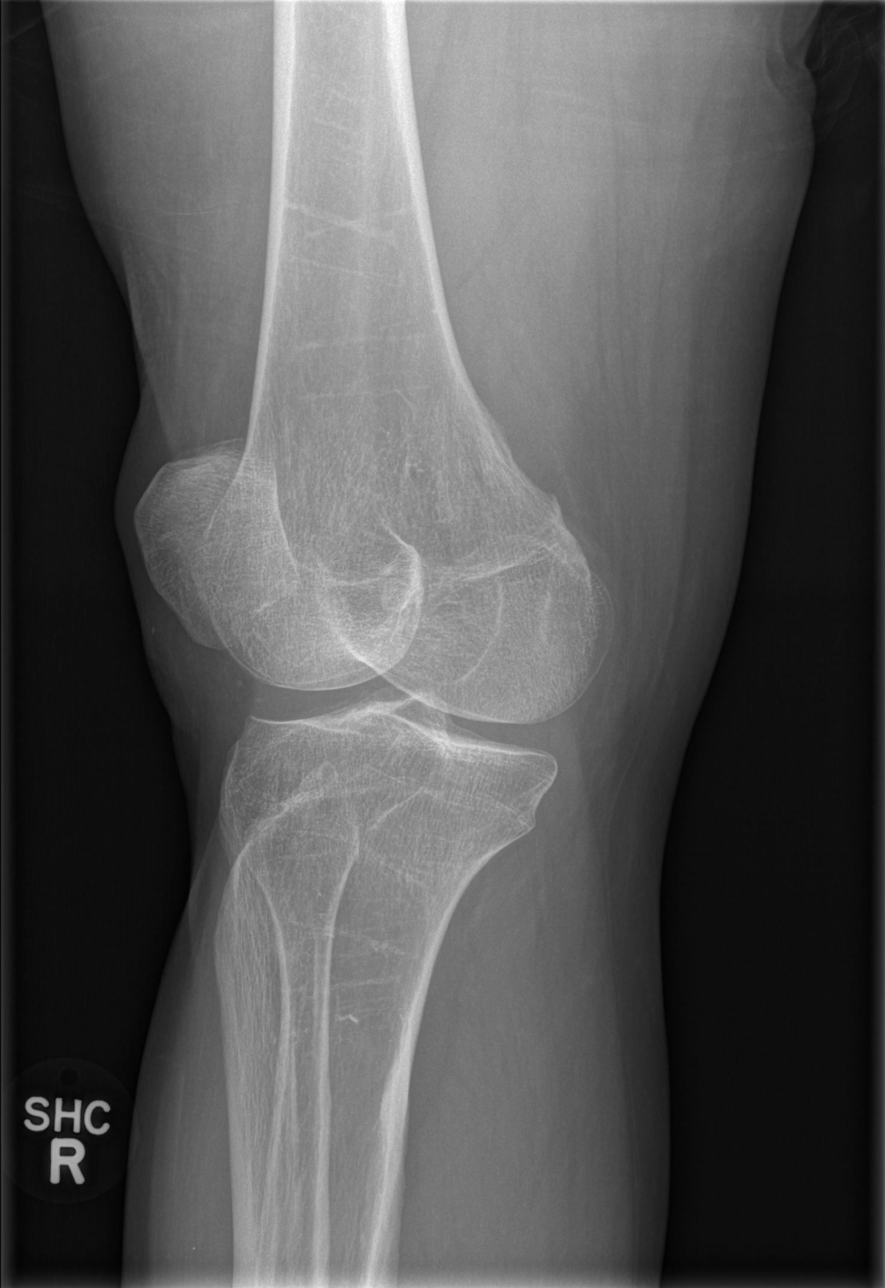

[t knee obl right (2 of 2)]
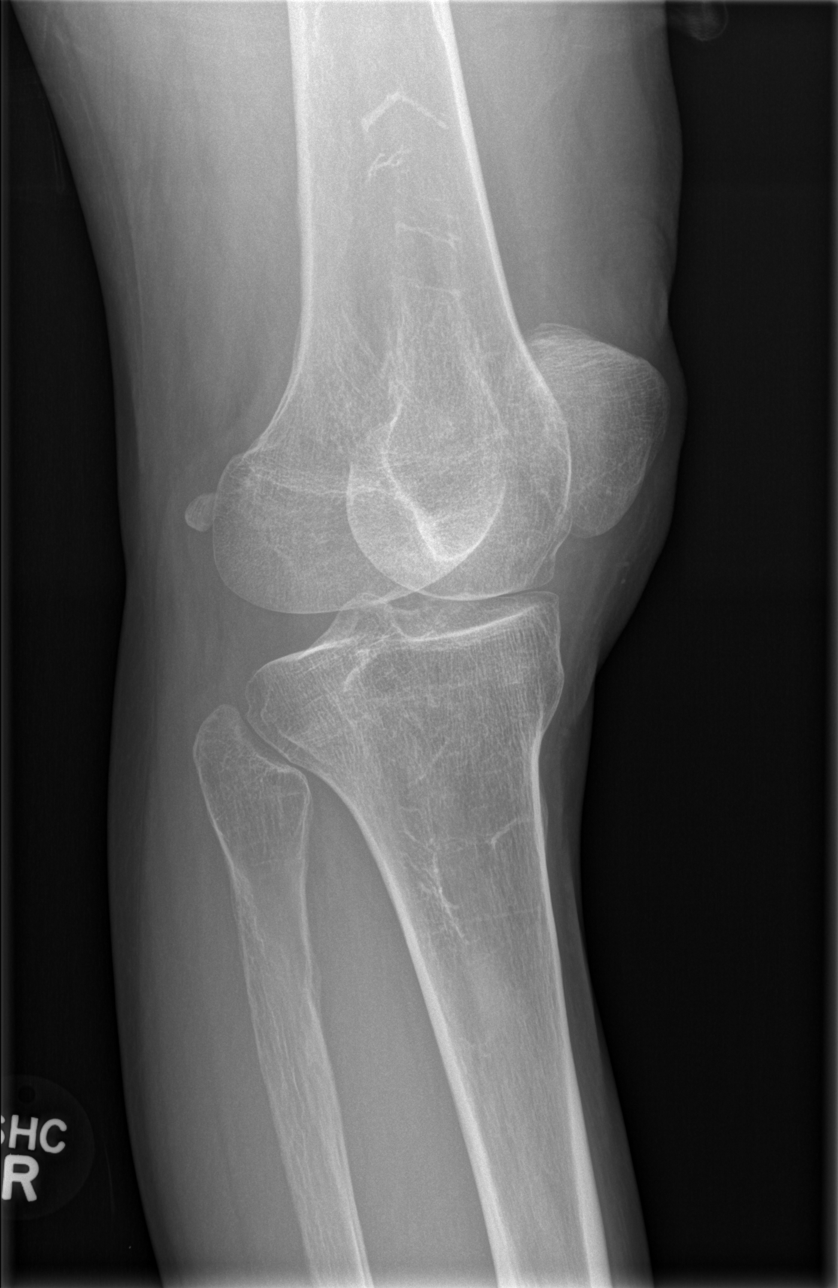

[t knee lat right]
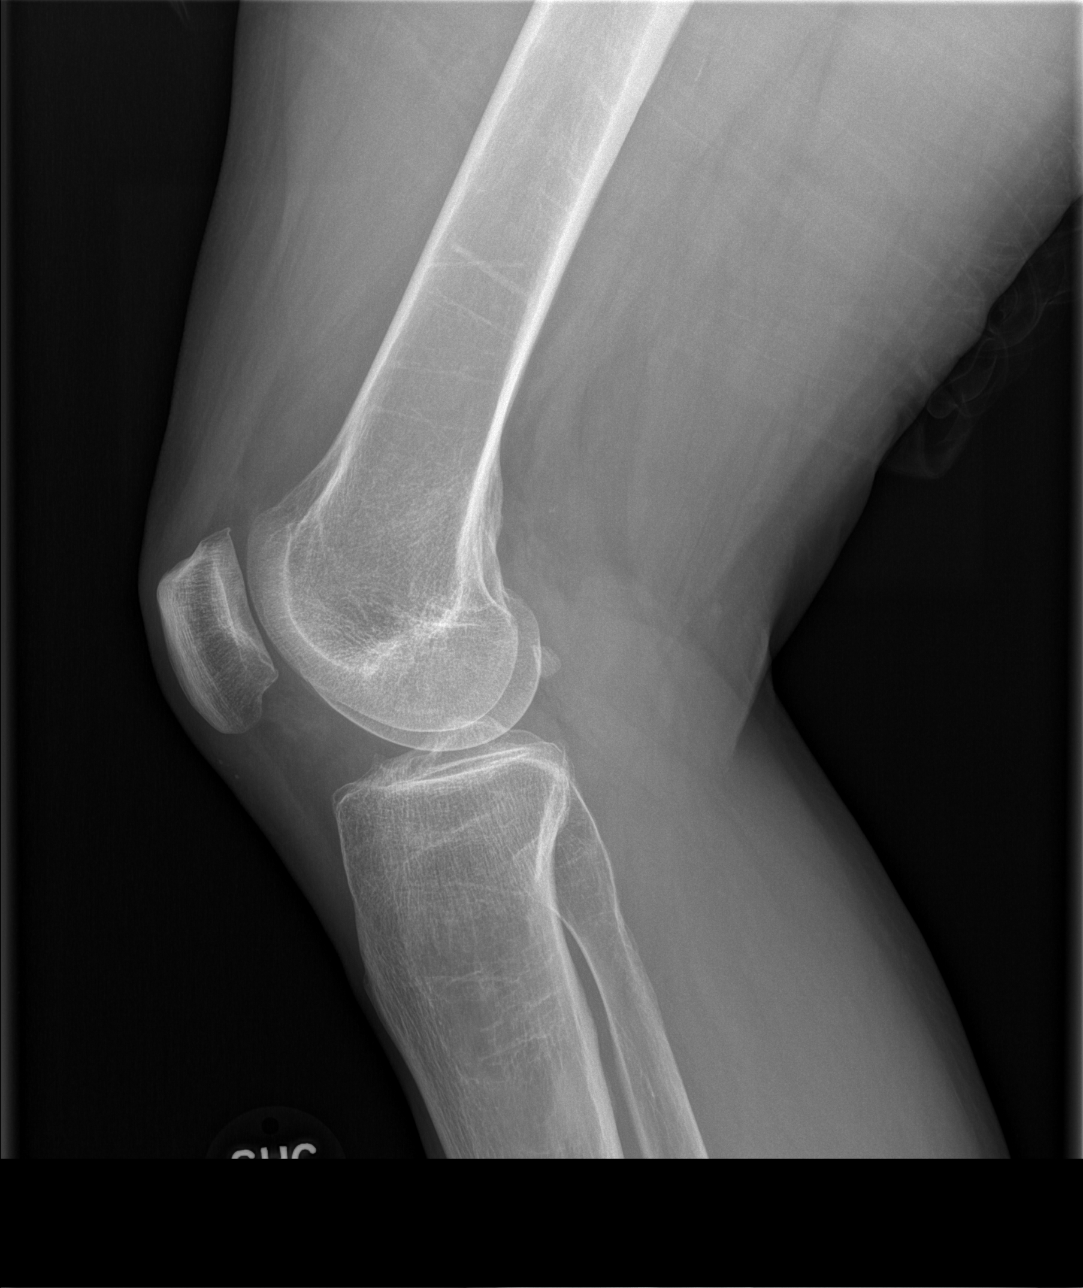

[4 of 4 positions shown; findings below may reference images not displayed]

FINDINGS: The bones are osteopenic.  No acute fracture is
identified. No joint effusion is seen. The joint spaces are
maintained.  No significant degenerative change.
IMPRESSION: The bones are osteopenic, especially for patient age.  Consider
bone mineral density testing if not previously performed.

No acute bony abnormality is identified.

## 2013-01-22 IMAGING — CR DG LUMBAR SPINE COMPLETE 4+V
5 series · 5 of 5 positions shown · non-contrast
Comparison: None.

CLINICAL DATA: Fall, pain.

LUMBAR SPINE - COMPLETE 4+ VIEW

[t lumbar spine ap]
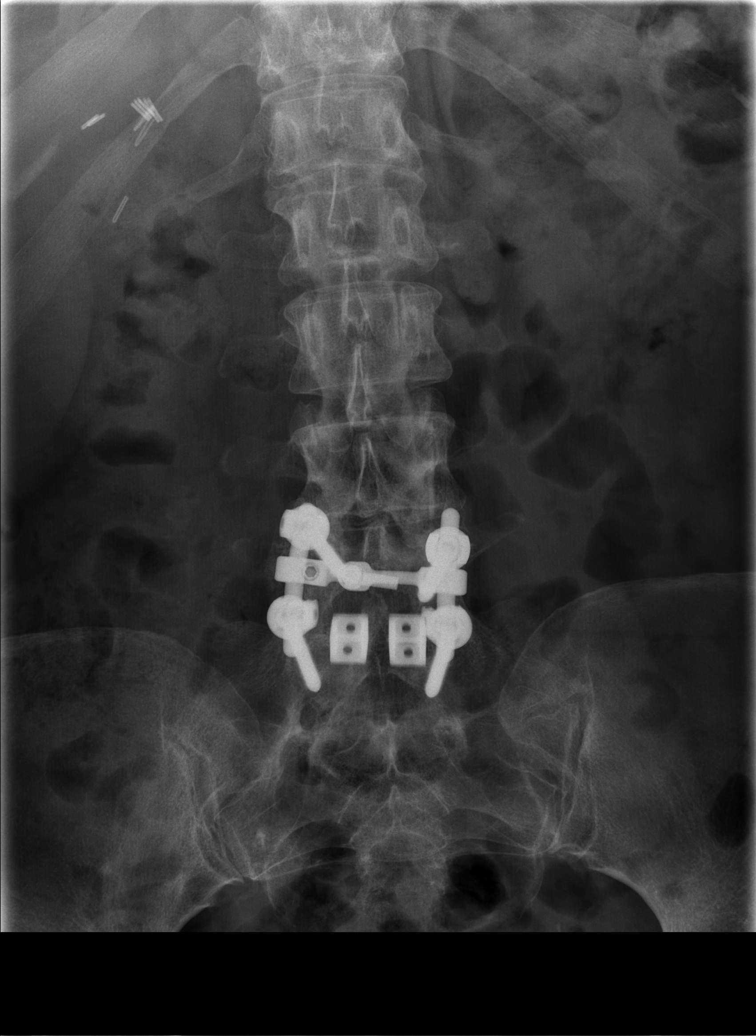

[t lumbar spine obl (1 of 2)]
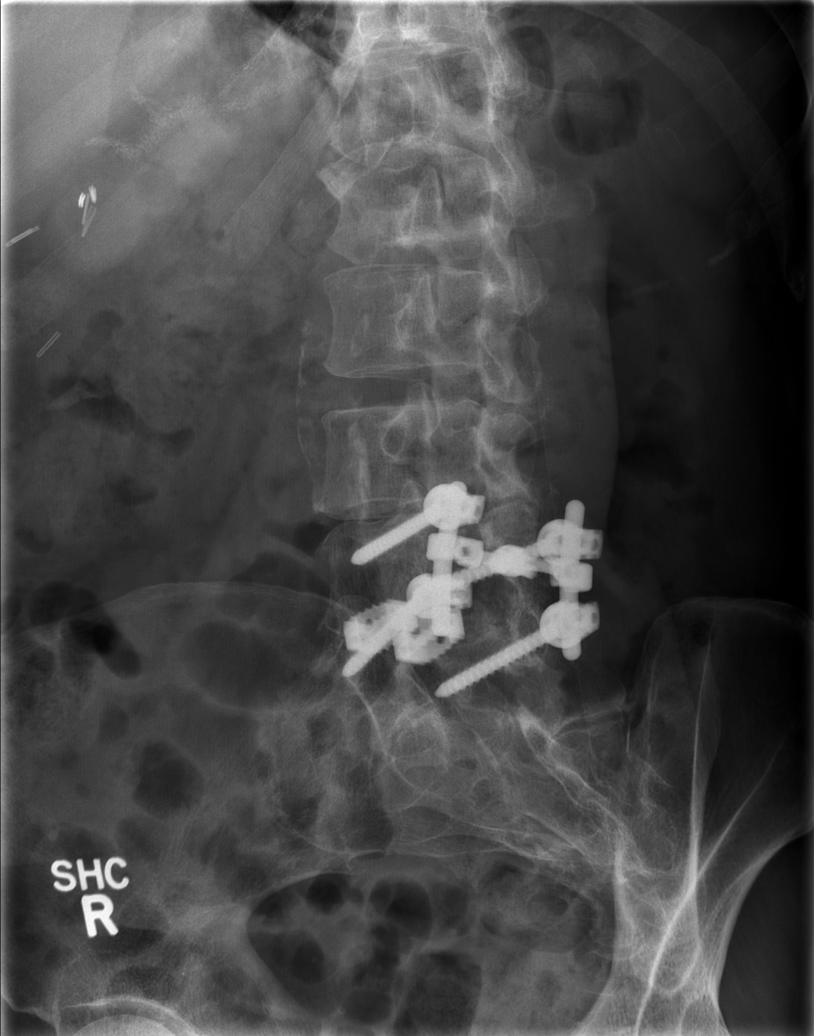

[t lumbar spine obl (2 of 2)]
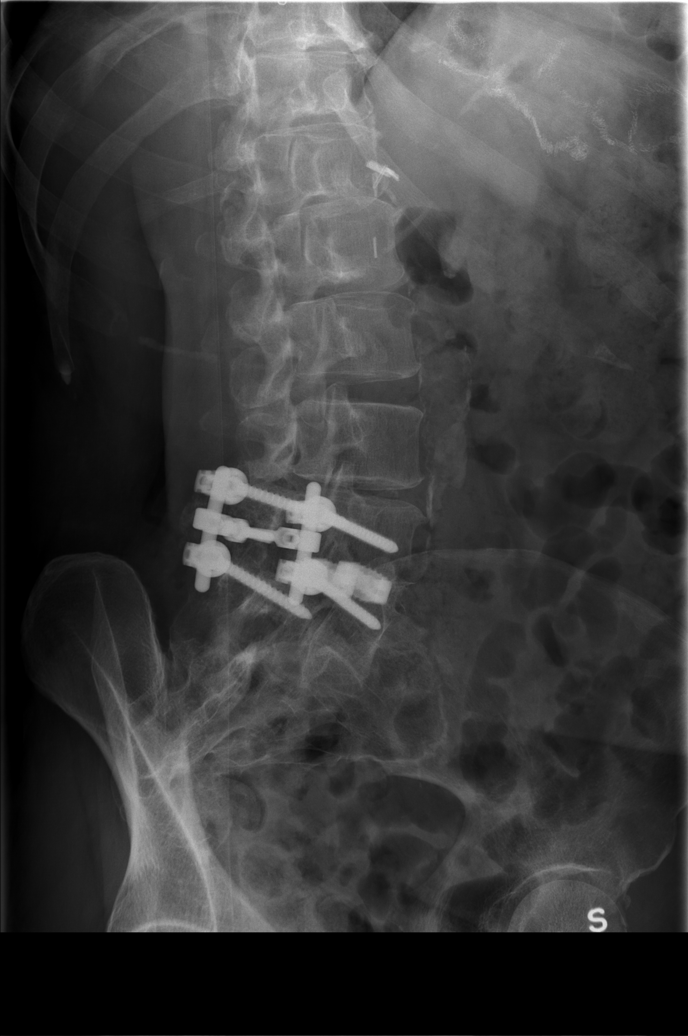

[t lumbar spine lat]
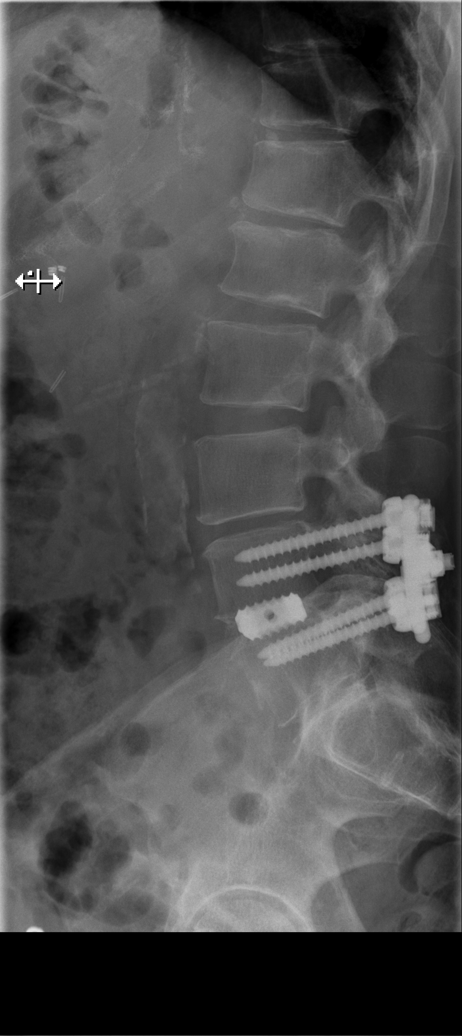

[t lumbar l-5 s-1 spot]
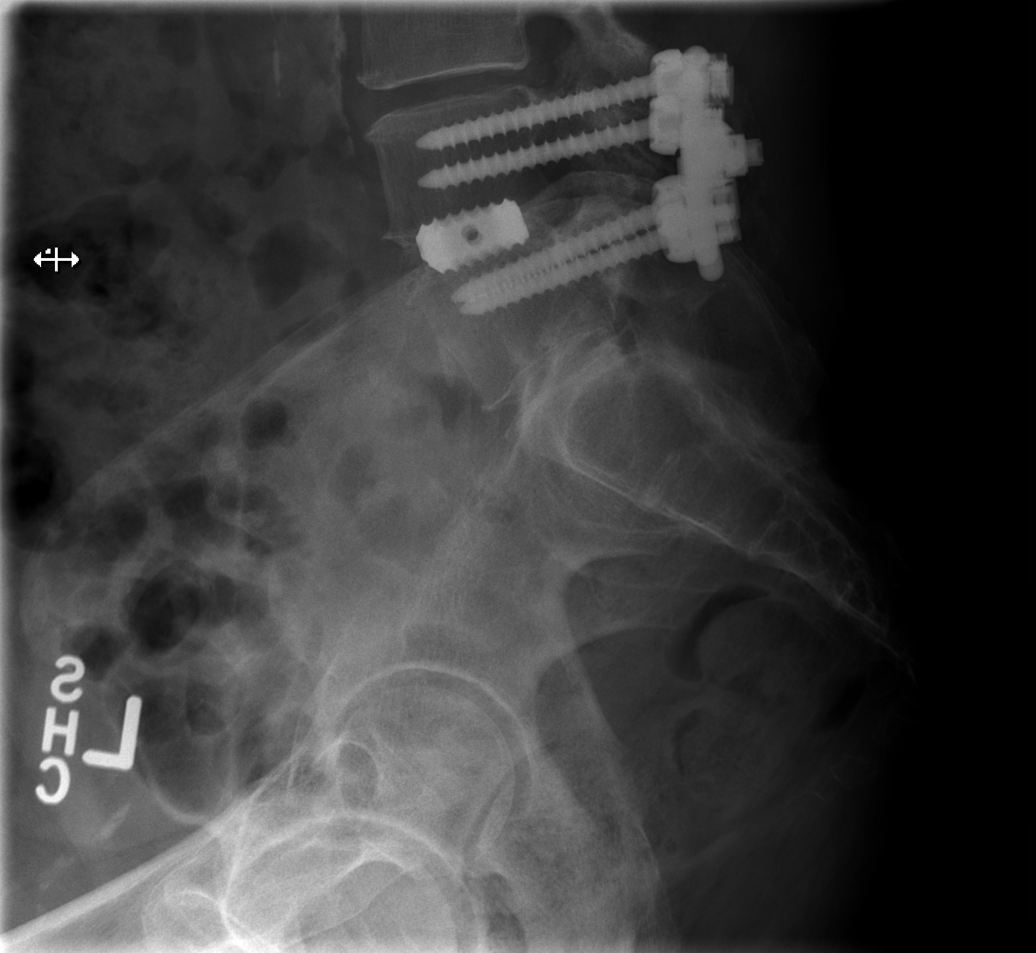

[5 of 5 positions shown; findings below may reference images not displayed]

FINDINGS: Vertebral body height and alignment are maintained.  The
patient is status post L4-5 fusion.  Also seen is loss of disc
space height in the visualized lower thoracic spine.
IMPRESSION: No acute finding.

## 2016-11-22 DEATH — deceased
